# Patient Record
Sex: Male | Born: 1985 | Race: White | Hispanic: No | Marital: Married | Smoking: Never smoker
Health system: Southern US, Community
[De-identification: ages and names within clinical notes are randomized; demographics above are authoritative.]

## PROBLEM LIST (undated history)

## (undated) DIAGNOSIS — J302 Other seasonal allergic rhinitis: Secondary | ICD-10-CM

## (undated) HISTORY — DX: Other seasonal allergic rhinitis: J30.2

---

## 2017-06-17 ENCOUNTER — Encounter: Payer: Self-pay | Admitting: Family Medicine

## 2017-06-17 ENCOUNTER — Ambulatory Visit (INDEPENDENT_AMBULATORY_CARE_PROVIDER_SITE_OTHER): Payer: Managed Care, Other (non HMO) | Admitting: Family Medicine

## 2017-06-17 VITALS — BP 110/72 | HR 87 | Temp 98.3°F | Ht 70.5 in | Wt 272.4 lb

## 2017-06-17 DIAGNOSIS — J301 Allergic rhinitis due to pollen: Secondary | ICD-10-CM | POA: Diagnosis not present

## 2017-06-17 DIAGNOSIS — E669 Obesity, unspecified: Secondary | ICD-10-CM

## 2017-06-17 DIAGNOSIS — E559 Vitamin D deficiency, unspecified: Secondary | ICD-10-CM

## 2017-06-17 DIAGNOSIS — K580 Irritable bowel syndrome with diarrhea: Secondary | ICD-10-CM

## 2017-06-17 DIAGNOSIS — R6882 Decreased libido: Secondary | ICD-10-CM

## 2017-06-17 DIAGNOSIS — Z13220 Encounter for screening for lipoid disorders: Secondary | ICD-10-CM

## 2017-06-17 DIAGNOSIS — B379 Candidiasis, unspecified: Secondary | ICD-10-CM | POA: Diagnosis not present

## 2017-06-17 LAB — COMPREHENSIVE METABOLIC PANEL
ALT: 22 U/L (ref 0–53)
AST: 16 U/L (ref 0–37)
Albumin: 4.5 g/dL (ref 3.5–5.2)
Alkaline Phosphatase: 98 U/L (ref 39–117)
BUN: 15 mg/dL (ref 6–23)
CO2: 29 mEq/L (ref 19–32)
Calcium: 9.5 mg/dL (ref 8.4–10.5)
Chloride: 104 mEq/L (ref 96–112)
Creatinine, Ser: 0.89 mg/dL (ref 0.40–1.50)
GFR: 106.79 mL/min (ref >60.00)
Glucose, Bld: 94 mg/dL (ref 70–99)
Potassium: 4.6 mEq/L (ref 3.5–5.1)
Sodium: 141 mEq/L (ref 135–145)
Total Bilirubin: 0.5 mg/dL (ref 0.2–1.2)
Total Protein: 7 g/dL (ref 6.0–8.3)

## 2017-06-17 LAB — CBC
HCT: 44.3 % (ref 39.0–52.0)
Hemoglobin: 15 g/dL (ref 13.0–17.0)
MCHC: 34 g/dL (ref 30.0–36.0)
MCV: 91.6 fl (ref 78.0–100.0)
Platelets: 227 10*3/uL (ref 150.0–400.0)
RBC: 4.84 Mil/uL (ref 4.22–5.81)
RDW: 13.7 % (ref 11.5–15.5)
WBC: 6.2 10*3/uL (ref 4.0–10.5)

## 2017-06-17 LAB — LIPID PANEL
Cholesterol: 165 mg/dL (ref 0–200)
HDL: 39.8 mg/dL (ref >39.00)
LDL Cholesterol: 117 mg/dL — ABNORMAL HIGH (ref 0–99)
NonHDL: 125.19
Total CHOL/HDL Ratio: 4
Triglycerides: 41 mg/dL (ref 0.0–149.0)
VLDL: 8.2 mg/dL (ref 0.0–40.0)

## 2017-06-17 LAB — SEDIMENTATION RATE: Sed Rate: 2 mm/hr (ref 0–15)

## 2017-06-17 LAB — MAGNESIUM: Magnesium: 1.9 mg/dL (ref 1.5–2.5)

## 2017-06-17 LAB — TESTOSTERONE: Testosterone: 340.75 ng/dL (ref 300.00–890.00)

## 2017-06-17 LAB — VITAMIN B12: Vitamin B-12: 312 pg/mL (ref 211–911)

## 2017-06-17 LAB — TSH: TSH: 1.75 u[IU]/mL (ref 0.35–4.50)

## 2017-06-17 LAB — PSA: PSA: 0.36 ng/mL (ref 0.10–4.00)

## 2017-06-17 LAB — C-REACTIVE PROTEIN: CRP: 0.5 mg/dL (ref 0.5–20.0)

## 2017-06-17 LAB — VITAMIN D 25 HYDROXY (VIT D DEFICIENCY, FRACTURES): VITD: 24.19 ng/mL — ABNORMAL LOW (ref 30.00–100.00)

## 2017-06-17 MED ORDER — FLUTICASONE PROPIONATE 50 MCG/ACT NA SUSP: 2.0000 | Freq: Every day | NASAL | 6 refills | Status: DC

## 2017-06-17 MED ORDER — PSYLLIUM 400 MG PO CAPS: 400.0000 mg | ORAL_CAPSULE | Freq: Every evening | ORAL | 1 refills | Status: DC | PRN

## 2017-06-17 MED ORDER — NYSTATIN-TRIAMCINOLONE 100000-0.1 UNIT/GM-% EX OINT: 1.0000 "application " | TOPICAL_OINTMENT | Freq: Two times a day (BID) | CUTANEOUS | 2 refills | Status: DC

## 2017-06-17 NOTE — Progress Notes (Signed)
Nicholas Decker is a 31 y.o. male is here to [employer].   Patient Care Team: Helane Rima, DO as PCP - General (Family Medicine)   History of Present Illness:   Britt Bottom CMA acting as scribe for Dr. Earlene Plater.  CC: Patient comes in today to establish care. He has several concerns today to discuss. He is would like to discuss IBS. He would also like to do some testing due to having a low sex drive. This has been going on for about a year. He also has decreased hearing in his left ear.   HPI:  1. Irritable bowel syndrome with diarrhea. Symptoms include abdominal cramps, daily diarrhea - up to eight times per day. Stools are semi-formed to liquid. Onset was 3 years ago. Symptoms have been unchanged since. Aggravating factors include stress.  Alleviating factors include none. Associated symptoms include none. The patient denies constipation, dysuria, fever, hematochezia, hematuria, melena, myalgias, nausea, sweats and vomiting. Psychosocial factors: depression: moderate. Work up so far: none.   2. Low libido. Onset of dysfunction was several months ago and was gradual in onset.  Patient states the nature of difficulty is decreased desire. Patient denies history of cardiovascular disease, diabetes mellitus and cranial, spinal, or pelvic trauma. Patient's expectations as to sexual function - he and his wife are trying to become pregnant.  Patient's description of relationship with partneris healthy.    3. Candidiasis Intermittent. Right groin. Has been using wife's Ketoconazole prn with relief.    4. Seasonal allergic rhinitis due to pollen. Not adequately treated.    PMHx, SurgHx, SocialHx, Medications, and Allergies were reviewed in the Visit Navigator and updated as appropriate.   Past Medical History:  Diagnosis Date  . Seasonal allergies    No past surgical history on file.  Family History  Problem Relation Age of Onset  . Cancer Father   . Heart disease Maternal  Grandmother   . Heart disease Maternal Grandfather   . Cancer Paternal Grandfather    Social History  Substance Use Topics  . Smoking status: Never Smoker  . Smokeless tobacco: Never Used  . Alcohol use Yes     Comment: Social maybe twice a year   Current Medications and Allergies:   .  cetirizine (ZYRTEC) 10 MG tablet, Take 10 mg by mouth daily., Disp: , Rfl:  .  Multiple Vitamins-Minerals (EQ MULTIVITAMINS ADULT GUMMY PO), Take by mouth., Disp: , Rfl:   No Known Allergies   Review of Systems:   Review of Systems  Constitutional: Negative for chills, fever and malaise/fatigue.  HENT: Negative for ear pain, sinus pain and sore throat.   Eyes: Negative for blurred vision and double vision.  Respiratory: Negative for cough, shortness of breath and wheezing.   Cardiovascular: Negative for chest pain, palpitations and leg swelling.  Gastrointestinal: Negative for abdominal pain, nausea and vomiting.  Musculoskeletal: Positive for joint pain. Negative for back pain and neck pain.       Both knees chronic.   Neurological: Negative for dizziness and headaches.  Psychiatric/Behavioral: Negative for depression, hallucinations and memory loss.   Vitals:   Vitals:   06/17/17 0924  BP: 110/72  Pulse: 87  Temp: 98.3 F (36.8 C)  TempSrc: Oral  SpO2: 96%  Weight: 272 lb 6.4 oz (123.6 kg)  Height: 5' 10.5" (1.791 m)     Body mass index is 38.53 kg/m.  Physical Exam:   Physical Exam  Constitutional: He is oriented to person, place, and time.  He appears well-developed and well-nourished. No distress.  HENT:  Head: Normocephalic and atraumatic.  Right Ear: External ear normal.  Left Ear: External ear normal.  Nose: Nose normal.  Mouth/Throat: Oropharynx is clear and moist.  Eyes: Conjunctivae and EOM are normal. Pupils are equal, round, and reactive to light.  Neck: Normal range of motion. Neck supple.  Cardiovascular: Normal rate, regular rhythm, normal heart sounds and  intact distal pulses.   Pulmonary/Chest: Effort normal and breath sounds normal.  Abdominal: Soft. Bowel sounds are normal.  Musculoskeletal: Normal range of motion.  Neurological: He is alert and oriented to person, place, and time.  Skin: Skin is warm and dry.  Psychiatric: He has a normal mood and affect. His behavior is normal. Judgment and thought content normal.  Nursing note and vitals reviewed.  Results for orders placed or performed in visit on 06/17/17  CBC  Result Value Ref Range   WBC 6.2 4.0 - 10.5 K/uL   RBC 4.84 4.22 - 5.81 Mil/uL   Platelets 227.0 150.0 - 400.0 K/uL   Hemoglobin 15.0 13.0 - 17.0 g/dL   HCT 45.444.3 09.839.0 - 11.952.0 %   MCV 91.6 78.0 - 100.0 fl   MCHC 34.0 30.0 - 36.0 g/dL   RDW 14.713.7 82.911.5 - 56.215.5 %  Comprehensive metabolic panel  Result Value Ref Range   Sodium 141 135 - 145 mEq/L   Potassium 4.6 3.5 - 5.1 mEq/L   Chloride 104 96 - 112 mEq/L   CO2 29 19 - 32 mEq/L   Glucose, Bld 94 70 - 99 mg/dL   BUN 15 6 - 23 mg/dL   Creatinine, Ser 1.300.89 0.40 - 1.50 mg/dL   Total Bilirubin 0.5 0.2 - 1.2 mg/dL   Alkaline Phosphatase 98 39 - 117 U/L   AST 16 0 - 37 U/L   ALT 22 0 - 53 U/L   Total Protein 7.0 6.0 - 8.3 g/dL   Albumin 4.5 3.5 - 5.2 g/dL   Calcium 9.5 8.4 - 86.510.5 mg/dL   GFR 784.69106.79 >62.95>60.00 mL/min  TSH  Result Value Ref Range   TSH 1.75 0.35 - 4.50 uIU/mL  Vitamin B12  Result Value Ref Range   Vitamin B-12 312 211 - 911 pg/mL  VITAMIN D 25 Hydroxy (Vit-D Deficiency, Fractures)  Result Value Ref Range   VITD 24.19 (L) 30.00 - 100.00 ng/mL  Magnesium  Result Value Ref Range   Magnesium 1.9 1.5 - 2.5 mg/dL  PSA  Result Value Ref Range   PSA 0.36 0.10 - 4.00 ng/mL  Testosterone  Result Value Ref Range   Testosterone 340.75 300.00 - 890.00 ng/dL  Sedimentation rate  Result Value Ref Range   Sed Rate 2 0 - 15 mm/hr  C-reactive protein  Result Value Ref Range   CRP 0.5 0.5 - 20.0 mg/dL  Lipid panel  Result Value Ref Range   Cholesterol 165 0 -  200 mg/dL   Triglycerides 28.441.0 0.0 - 149.0 mg/dL   HDL 13.2439.80 >40.10>39.00 mg/dL   VLDL 8.2 0.0 - 27.240.0 mg/dL   LDL Cholesterol 536117 (H) 0 - 99 mg/dL   Total CHOL/HDL Ratio 4    NonHDL 125.19     Assessment and Plan:   Caryn BeeKevin was seen today for establish care.  Diagnoses and all orders for this visit:  Irritable bowel syndrome with diarrhea Comments: Diarrhea daily. Labs reassuring. Will start with low FODMAP diet and psyllium. Will send to GI for further evaluation if no improvement.  Orders: -  Psyllium 400 MG CAPS; Take 400 mg by mouth at bedtime as needed. -     CBC -     Comprehensive metabolic panel -     Vitamin B12 -     Magnesium -     Sedimentation rate -     C-reactive protein  Low libido Comments: Labs reassuring. Testosterone mildly low. Recommend exercise and restorative sleep. If no improvement, consider sleep study. Orders: -     TSH -     PSA -     Testosterone  Candidiasis -     nystatin-triamcinolone ointment (MYCOLOG); Apply 1 application topically 2 (two) times daily.  Lipid screening -     Lipid panel  Obesity (BMI 30-39.9) Comments: The patient is asked to make an attempt to improve diet and exercise patterns to aid in medical management of this problem.  Orders: -     VITAMIN D 25 Hydroxy (Vit-D Deficiency, Fractures)  Seasonal allergic rhinitis due to pollen -     fluticasone (FLONASE) 50 MCG/ACT nasal spray; Place 2 sprays into both nostrils daily.  Vitamin D deficiency -     VITAMIN D 25 Hydroxy (Vit-D Deficiency, Fractures) -     Cholecalciferol 50000 units TABS; 50,000 units PO qwk for 8 weeks.    . Reviewed expectations re: course of current medical issues. . Discussed self-management of symptoms. . Outlined signs and symptoms indicating need for more acute intervention. . Patient verbalized understanding and all questions were answered. Marland Kitchen Health Maintenance issues including appropriate healthy diet, exercise, and smoking avoidance  were discussed with patient. . See orders for this visit as documented in the electronic medical record. . Patient received an After Visit Summary.  CMA served as Neurosurgeon during this visit. History, Physical, and Plan performed by medical provider. The above documentation has been reviewed and is accurate and complete. Helane Rima, D.O.  Helane Rima, DO [place], [place] 06/22/2017  Future Appointments Date Time Provider Department Center  09/17/2017 7:30 AM Helane Rima, DO [facility] None

## 2017-06-22 ENCOUNTER — Encounter: Payer: Self-pay | Admitting: Family Medicine

## 2017-06-22 DIAGNOSIS — E669 Obesity, unspecified: Secondary | ICD-10-CM | POA: Insufficient documentation

## 2017-06-22 MED ORDER — CHOLECALCIFEROL 1.25 MG (50000 UT) PO TABS: ORAL_TABLET | ORAL | 0 refills | Status: DC

## 2017-09-17 ENCOUNTER — Encounter: Payer: Self-pay | Admitting: Family Medicine

## 2017-09-17 ENCOUNTER — Ambulatory Visit (INDEPENDENT_AMBULATORY_CARE_PROVIDER_SITE_OTHER): Payer: Managed Care, Other (non HMO) | Admitting: Family Medicine

## 2017-09-17 VITALS — BP 116/68 | HR 97 | Temp 98.2°F | Ht 70.5 in | Wt 284.0 lb

## 2017-09-17 DIAGNOSIS — G4730 Sleep apnea, unspecified: Secondary | ICD-10-CM | POA: Diagnosis not present

## 2017-09-17 DIAGNOSIS — F341 Dysthymic disorder: Secondary | ICD-10-CM

## 2017-09-17 MED ORDER — BUPROPION HCL ER (XL) 150 MG PO TB24: 150.0000 mg | ORAL_TABLET | Freq: Every day | ORAL | 3 refills | Status: DC

## 2017-09-17 NOTE — Patient Instructions (Signed)
Your vitamin D level is low. This is not dangerous but can affect both your mental and physical health. There is a lot of debate at present about the significance of this finding but we recommend that you take extra vitamin D to get you back into the normal range. I would suggest you pick up an over the counter supplement. Usually 2,000 units daily is a nice maintenance dose.

## 2017-09-17 NOTE — Progress Notes (Signed)
Nicholas Decker is a 31 y.o. male is here for follow up.  History of Present Illness:   Britt Bottom CMA acting as scribe for Dr. Earlene Plater.  HPI:   1. Morbid obesity.   Wt Readings from Last 3 Encounters:  09/17/17 284 lb (128.8 kg)  06/17/17 272 lb 6.4 oz (123.6 kg)    2. Dysthymia.   Depression symptoms: anhedonia, fatigue. Current psychosocial stressors include: home, work.   Treatment to date has included Medication.  Symptoms have been gradually worsening since onset of treatment.   Alcohol use: no.  Drug use: no.  Exercise: minimal.   Patient denies current suicidal and homicidal ideation.    3. Fatigue.   Lab Results  Component Value Date   TESTOSTERONE 340.75 06/17/2017     Health Maintenance Due  Topic Date Due  . INFLUENZA VACCINE  07/30/2017   Depression screen [facility] 2/9 09/17/2017 09/17/2017  Decreased Interest 2 0  Down, Depressed, Hopeless 1 0  PHQ - 2 Score 3 0  Altered sleeping 3 -  Tired, decreased energy 3 -  Change in appetite 1 -  Feeling bad or failure about yourself  2 -  Trouble concentrating 3 -  Moving slowly or fidgety/restless 1 -  Suicidal thoughts 0 -  PHQ-9 Score 16 -  Difficult doing work/chores Not difficult at all -   PMHx, SurgHx, SocialHx, FamHx, Medications, and Allergies were reviewed in the Visit Navigator and updated as appropriate.   Patient Active Problem List   Diagnosis Date Noted  . Obesity (BMI 30-39.9) 06/22/2017   Social History  Substance Use Topics  . Smoking status: Never Smoker  . Smokeless tobacco: Never Used  . Alcohol use Yes     Comment: Social maybe twice a year   Current Medications and Allergies:   .  cetirizine (ZYRTEC) 10 MG tablet, Take 10 mg by mouth daily., Disp: , Rfl:  .  Cholecalciferol 50000 units TABS, 50,000 units PO qwk for 8 weeks., Disp: 8 tablet, Rfl: 0 .  fluticasone (FLONASE) 50 MCG/ACT nasal spray, Place 2 sprays into both nostrils daily., Disp: 16 g, Rfl: 6 .  Multiple  Vitamins-Minerals (EQ MULTIVITAMINS ADULT GUMMY PO), Take by mouth., Disp: , Rfl:  .  nystatin-triamcinolone ointment (MYCOLOG), Apply 1 application topically 2 (two) times daily., Disp: 60 g, Rfl: 2  No Known Allergies   Review of Systems   Pertinent items are noted in the HPI. Otherwise, ROS is negative.  Vitals:   Vitals:   09/17/17 0740  BP: 116/68  Pulse: 97  Temp: 98.2 F (36.8 C)  TempSrc: Oral  SpO2: 100%  Weight: 284 lb (128.8 kg)  Height: 5' 10.5" (1.791 m)     Body mass index is 40.17 kg/m.   Physical Exam:   Physical Exam  Constitutional: He is oriented to person, place, and time. He appears well-developed and well-nourished. No distress.  HENT:  Head: Normocephalic and atraumatic.  Right Ear: External ear normal.  Left Ear: External ear normal.  Nose: Nose normal.  Mouth/Throat: Oropharynx is clear and moist.  Eyes: Pupils are equal, round, and reactive to light. Conjunctivae and EOM are normal.  Neck: Normal range of motion. Neck supple.  Cardiovascular: Normal rate, regular rhythm, normal heart sounds and intact distal pulses.   Pulmonary/Chest: Effort normal and breath sounds normal.  Abdominal: Soft. Bowel sounds are normal.  Musculoskeletal: Normal range of motion.  Neurological: He is alert and oriented to person, place, and time.  Skin:  Skin is warm and dry.  Psychiatric: He has a normal mood and affect. His behavior is normal. Judgment and thought content normal.  Nursing note and vitals reviewed.   Results for orders placed or performed in visit on 06/17/17  CBC  Result Value Ref Range   WBC 6.2 4.0 - 10.5 K/uL   RBC 4.84 4.22 - 5.81 Mil/uL   Platelets 227.0 150.0 - 400.0 K/uL   Hemoglobin 15.0 13.0 - 17.0 g/dL   HCT 16.1 09.6 - 04.5 %   MCV 91.6 78.0 - 100.0 fl   MCHC 34.0 30.0 - 36.0 g/dL   RDW 40.9 81.1 - 91.4 %  Comprehensive metabolic panel  Result Value Ref Range   Sodium 141 135 - 145 mEq/L   Potassium 4.6 3.5 - 5.1 mEq/L    Chloride 104 96 - 112 mEq/L   CO2 29 19 - 32 mEq/L   Glucose, Bld 94 70 - 99 mg/dL   BUN 15 6 - 23 mg/dL   Creatinine, Ser 7.82 0.40 - 1.50 mg/dL   Total Bilirubin 0.5 0.2 - 1.2 mg/dL   Alkaline Phosphatase 98 39 - 117 U/L   AST 16 0 - 37 U/L   ALT 22 0 - 53 U/L   Total Protein 7.0 6.0 - 8.3 g/dL   Albumin 4.5 3.5 - 5.2 g/dL   Calcium 9.5 8.4 - 95.6 mg/dL   GFR 213.08 >65.78 mL/min  TSH  Result Value Ref Range   TSH 1.75 0.35 - 4.50 uIU/mL  Vitamin B12  Result Value Ref Range   Vitamin B-12 312 211 - 911 pg/mL  VITAMIN D 25 Hydroxy (Vit-D Deficiency, Fractures)  Result Value Ref Range   VITD 24.19 (L) 30.00 - 100.00 ng/mL  Magnesium  Result Value Ref Range   Magnesium 1.9 1.5 - 2.5 mg/dL  PSA  Result Value Ref Range   PSA 0.36 0.10 - 4.00 ng/mL  Testosterone  Result Value Ref Range   Testosterone 340.75 300.00 - 890.00 ng/dL  Sedimentation rate  Result Value Ref Range   Sed Rate 2 0 - 15 mm/hr  C-reactive protein  Result Value Ref Range   CRP 0.5 0.5 - 20.0 mg/dL  Lipid panel  Result Value Ref Range   Cholesterol 165 0 - 200 mg/dL   Triglycerides 46.9 0.0 - 149.0 mg/dL   HDL 62.95 >28.41 mg/dL   VLDL 8.2 0.0 - 32.4 mg/dL   LDL Cholesterol 401 (H) 0 - 99 mg/dL   Total CHOL/HDL Ratio 4    NonHDL 125.19    Assessment and Plan:   Diagnoses and all orders for this visit:  Morbid obesity (HCC) Comments: The patient is asked to make an attempt to improve diet and exercise patterns to aid in medical management of this problem.  Orders: -     Home sleep test; Future  Dysthymia Comments: Patient was given information on SNRIs and possible side effects were reviewed. He was asked to contact us with any worsening in symptoms or suicidal thoughts and we discussed that it would take 2-4 weeks to begin to see improvement in his symptoms.  Orders:  -     buPROPion (WELLBUTRIN XL) 150 MG 24 hr tablet; Take 1 tablet (150 mg total) by mouth daily.  Sleep-disordered  breathing -     Home sleep test; Future   . Reviewed expectations re: course of current medical issues. . Discussed self-management of symptoms. . Outlined signs and symptoms indicating need for more acute intervention. Marland Kitchen  Patient verbalized understanding and all questions were answered. Marland Kitchen Health Maintenance issues including appropriate healthy diet, exercise, and smoking avoidance were discussed with patient. . See orders for this visit as documented in the electronic medical record. . Patient received an After Visit Summary.  CMA served as Neurosurgeon during this visit. History, Physical, and Plan performed by medical provider. The above documentation has been reviewed and is accurate and complete. Helane Rima, D.O.  Helane Rima, DO [place], [place] 09/20/2017  No future appointments.

## 2017-10-13 ENCOUNTER — Telehealth: Payer: Self-pay | Admitting: *Deleted

## 2017-10-13 NOTE — Telephone Encounter (Signed)
Will speak with Mellody Dance as this likely has to do with the referral that was placed for sleep study.

## 2017-10-13 NOTE — Telephone Encounter (Signed)
Chaz from [facility] is calling pertiaing to the patient sleepy study and the forms that was filled out . He is requesting a call back . His call back number is [phone]. Please advise. Thank you

## 2017-10-14 NOTE — Telephone Encounter (Signed)
Nicholas Decker - can you provide feedback ?

## 2017-10-14 NOTE — Telephone Encounter (Signed)
Spoke with Nicholas Decker- following up to explain their new procedures for HST, and how it affects our clinic and current patient. Nothing changing, will follow up as needed. Will give information to patients PCP.

## 2017-11-30 ENCOUNTER — Other Ambulatory Visit: Payer: Self-pay

## 2017-11-30 ENCOUNTER — Emergency Department (HOSPITAL_COMMUNITY)
Admission: EM | Admit: 2017-11-30 | Discharge: 2017-11-30 | Disposition: A | Discharge status: Home / Self Care | Payer: Managed Care, Other (non HMO) | Attending: Emergency Medicine | Admitting: Emergency Medicine

## 2017-11-30 ENCOUNTER — Encounter (HOSPITAL_COMMUNITY): Payer: Self-pay | Admitting: Emergency Medicine

## 2017-11-30 ENCOUNTER — Emergency Department (HOSPITAL_COMMUNITY): Payer: Managed Care, Other (non HMO)

## 2017-11-30 DIAGNOSIS — Z79899 Other long term (current) drug therapy: Secondary | ICD-10-CM | POA: Insufficient documentation

## 2017-11-30 DIAGNOSIS — N200 Calculus of kidney: Secondary | ICD-10-CM | POA: Diagnosis not present

## 2017-11-30 DIAGNOSIS — J302 Other seasonal allergic rhinitis: Secondary | ICD-10-CM | POA: Diagnosis not present

## 2017-11-30 DIAGNOSIS — R1032 Left lower quadrant pain: Secondary | ICD-10-CM | POA: Diagnosis present

## 2017-11-30 LAB — URINALYSIS, ROUTINE W REFLEX MICROSCOPIC
Bilirubin Urine: NEGATIVE
GLUCOSE, UA: NEGATIVE mg/dL
KETONES UR: NEGATIVE mg/dL
Leukocytes, UA: NEGATIVE
NITRITE: NEGATIVE
PH: 5 (ref 5.0–8.0)
Protein, ur: NEGATIVE mg/dL
SPECIFIC GRAVITY, URINE: 1.034 — AB (ref 1.005–1.030)

## 2017-11-30 LAB — COMPREHENSIVE METABOLIC PANEL
ALBUMIN: 4 g/dL (ref 3.5–5.0)
ALK PHOS: 100 U/L (ref 38–126)
ALT: 23 U/L (ref 17–63)
AST: 21 U/L (ref 15–41)
Anion gap: 10 (ref 5–15)
BILIRUBIN TOTAL: 0.9 mg/dL (ref 0.3–1.2)
BUN: 18 mg/dL (ref 6–20)
CALCIUM: 9 mg/dL (ref 8.9–10.3)
CO2: 24 mmol/L (ref 22–32)
CREATININE: 1.14 mg/dL (ref 0.61–1.24)
Chloride: 104 mmol/L (ref 101–111)
GFR calc Af Amer: >60 mL/min (ref >60)
GFR calc non Af Amer: >60 mL/min (ref >60)
GLUCOSE: 121 mg/dL — AB (ref 65–99)
Potassium: 3.7 mmol/L (ref 3.5–5.1)
SODIUM: 138 mmol/L (ref 135–145)
TOTAL PROTEIN: 7.5 g/dL (ref 6.5–8.1)

## 2017-11-30 LAB — CBC
HCT: 42.3 % (ref 39.0–52.0)
Hemoglobin: 14.7 g/dL (ref 13.0–17.0)
MCH: 31.3 pg (ref 26.0–34.0)
MCHC: 34.8 g/dL (ref 30.0–36.0)
MCV: 90 fL (ref 78.0–100.0)
PLATELETS: 205 10*3/uL (ref 150–400)
RBC: 4.7 MIL/uL (ref 4.22–5.81)
RDW: 13.1 % (ref 11.5–15.5)
WBC: 11 10*3/uL — ABNORMAL HIGH (ref 4.0–10.5)

## 2017-11-30 LAB — LIPASE, BLOOD: Lipase: 81 U/L — ABNORMAL HIGH (ref 11–51)

## 2017-11-30 MED ORDER — IBUPROFEN 600 MG PO TABS: 600.0000 mg | ORAL_TABLET | Freq: Four times a day (QID) | ORAL | 0 refills | Status: DC | PRN

## 2017-11-30 MED ORDER — MORPHINE SULFATE (PF) 4 MG/ML IV SOLN
4.0000 mg | Freq: Once | INTRAVENOUS | Status: AC
  Administered 2017-11-30: 4 mg via INTRAVENOUS
  Filled 2017-11-30: qty 1

## 2017-11-30 MED ORDER — KETOROLAC TROMETHAMINE 30 MG/ML IJ SOLN
30.0000 mg | Freq: Once | INTRAMUSCULAR | Status: AC
  Administered 2017-11-30: 30 mg via INTRAVENOUS
  Filled 2017-11-30: qty 1

## 2017-11-30 MED ORDER — TAMSULOSIN HCL 0.4 MG PO CAPS: 0.4000 mg | ORAL_CAPSULE | Freq: Every day | ORAL | 0 refills | Status: DC

## 2017-11-30 MED ORDER — ONDANSETRON HCL 4 MG/2ML IJ SOLN
4.0000 mg | Freq: Once | INTRAMUSCULAR | Status: AC
  Administered 2017-11-30: 4 mg via INTRAVENOUS
  Filled 2017-11-30: qty 2

## 2017-11-30 NOTE — ED Provider Notes (Signed)
[facility]-EMERGENCY DEPT Provider Note   CSN: [id number] Arrival date & time: 11/30/17  4098     History   Chief Complaint Chief Complaint  Patient presents with  . Abdominal Pain    HPI Nicholas Decker is a 31 y.o. male.  HPI   31 year old male presenting for evaluation of abdominal pain.  Patient developed acute onset of left lower quadrant abdominal pain that started approximately 3 hours ago while waking him up from sleep.  Pain is intense, nonradiating, nothing seems to make it better or worse.  He felt nauseous and vomited but it provided no relief.  He tries taking some cold and flu medication to help him back to sleep.  He states that no position is comfortable for him.  He has never had this kind of pain before.  No associated fever, chills, lightheadedness, dizziness, chest pain, shortness of breath, dysuria, hematuria, bowel or bladder changes, recent injury, or rash.  No history of kidney stone.  No prior history of abdominal surgery.  States pain is moderate to severe currently.  Past Medical History:  Diagnosis Date  . Seasonal allergies     Patient Active Problem List   Diagnosis Date Noted  . Obesity (BMI 30-39.9) 06/22/2017         Home Medications    Prior to Admission medications   Medication Sig Start Date End Date Taking? Authorizing Provider  buPROPion (WELLBUTRIN XL) 150 MG 24 hr tablet Take 1 tablet (150 mg total) by mouth daily. 09/17/17   Helane Rima, DO  cetirizine (ZYRTEC) 10 MG tablet Take 10 mg by mouth daily.    [provider]  Cholecalciferol 50000 units TABS 50,000 units PO qwk for 8 weeks. 06/22/17   Helane Rima, DO  fluticasone (FLONASE) 50 MCG/ACT nasal spray Place 2 sprays into both nostrils daily. 06/17/17   Helane Rima, DO  Multiple Vitamins-Minerals (EQ MULTIVITAMINS ADULT GUMMY PO) Take by mouth.    [provider]  nystatin-triamcinolone ointment (MYCOLOG) Apply 1 application  topically 2 (two) times daily. 06/17/17   Helane Rima, DO  Psyllium 400 MG CAPS Take 400 mg by mouth at bedtime as needed. 06/17/17   Helane Rima, DO    Family History Family History  Problem Relation Age of Onset  . Cancer Father   . Heart disease Maternal Grandmother   . Heart disease Maternal Grandfather   . Cancer Paternal Grandfather     Social History Social History   Tobacco Use  . Smoking status: Never Smoker  . Smokeless tobacco: Never Used  Substance Use Topics  . Alcohol use: Yes    Comment: Social maybe twice a year  . Drug use: No     Allergies   Patient has no known allergies.   Review of Systems Review of Systems  All other systems reviewed and are negative.    Physical Exam Updated Vital Signs BP 117/71 (BP Location: Left Arm)   Pulse 66   Temp 97.7 F (36.5 C) (Oral)   Resp 20   Ht 5\' 11"  (1.803 m)   Wt 122.5 kg (270 lb)   SpO2 100%   BMI 37.66 kg/m   Physical Exam  Constitutional: He appears well-developed and well-nourished. He appears distressed (Patient appears uncomfortable, writhing around).  HENT:  Head: Atraumatic.  Eyes: Conjunctivae are normal.  Neck: Neck supple.  Cardiovascular: Normal rate and regular rhythm.  Pulmonary/Chest: Effort normal and breath sounds normal.  Abdominal: Soft. Normal appearance. There is tenderness (  Mild tenderness left lower quadrant on palpation without guarding or rebound tenderness). There is no CVA tenderness, no tenderness at McBurney's point and negative Murphy's sign. No hernia. Hernia confirmed negative in the right inguinal area and confirmed negative in the left inguinal area.  Genitourinary: Testes normal and penis normal. Right testis shows no tenderness. Left testis shows no tenderness.  Neurological: He is alert.  Skin: No rash noted.  Psychiatric: He has a normal mood and affect.  Nursing note and vitals reviewed.    ED Treatments / Results  Labs (all labs ordered are listed,  but only abnormal results are displayed) Labs Reviewed  LIPASE, BLOOD - Abnormal; Notable for the following components:      Result Value   Lipase 81 (*)    All other components within normal limits  COMPREHENSIVE METABOLIC PANEL - Abnormal; Notable for the following components:   Glucose, Bld 121 (*)    All other components within normal limits  CBC - Abnormal; Notable for the following components:   WBC 11.0 (*)    All other components within normal limits  URINALYSIS, ROUTINE W REFLEX MICROSCOPIC - Abnormal; Notable for the following components:   Specific Gravity, Urine 1.034 (*)    Hgb urine dipstick SMALL (*)    Bacteria, UA FEW (*)    Squamous Epithelial / LPF 0-5 (*)    All other components within normal limits    EKG  EKG Interpretation None       Radiology Ct Renal Stone Study  Result Date: 11/30/2017 CLINICAL DATA:  Left abdominal pain EXAM: CT ABDOMEN AND PELVIS WITHOUT CONTRAST TECHNIQUE: Multidetector CT imaging of the abdomen and pelvis was performed following the standard protocol without IV contrast. COMPARISON:  None. FINDINGS: Lower chest: Lung bases are clear. No effusions. Heart is normal size. Hepatobiliary: No focal hepatic abnormality. Gallbladder unremarkable. Pancreas: No focal abnormality or ductal dilatation. Spleen: No focal abnormality.  Normal size. Adrenals/Urinary Tract: Adrenal glands unremarkable. Punctate nonobstructing bilateral renal stones. Mild left hydronephrosis due to a punctate 2 mm distal left ureteral stone. Urinary bladder decompressed. Stomach/Bowel: Stomach, large and small bowel grossly unremarkable. Vascular/Lymphatic: No evidence of aneurysm or adenopathy. Reproductive: No visible focal abnormality. Other: No free fluid or free air. Musculoskeletal: No acute bony abnormality. IMPRESSION: 2 mm distal left ureteral stone with mild left hydronephrosis. Bilateral punctate nephrolithiasis. Electronically Signed   By: Charlett NoseKevin  Dover M.D.   On:  11/30/2017 07:13    Procedures Procedures (including critical care time)  Medications Ordered in ED Medications  morphine 4 MG/ML injection 4 mg (4 mg Intravenous Given 11/30/17 0647)  ondansetron (ZOFRAN) injection 4 mg (4 mg Intravenous Given 11/30/17 0647)  ketorolac (TORADOL) 30 MG/ML injection 30 mg (30 mg Intravenous Given 11/30/17 0747)     Initial Impression / Assessment and Plan / ED Course  I have reviewed the triage vital signs and the nursing notes.  Pertinent labs & imaging results that were available during my care of the patient were reviewed by me and considered in my medical decision making (see chart for details).     BP (!) 111/57 (BP Location: Left Arm)   Pulse 92   Temp 97.7 F (36.5 C) (Oral)   Resp 18   Ht 5\' 11"  (1.803 m)   Wt 122.5 kg (270 lb)   SpO2 97%   BMI 37.66 kg/m    Final Clinical Impressions(s) / ED Diagnoses   Final diagnoses:  Kidney stone on left side  ED Discharge Orders        Ordered    ibuprofen (ADVIL,MOTRIN) 600 MG tablet  Every 6 hours PRN     11/30/17 0955    tamsulosin (FLOMAX) 0.4 MG CAPS capsule  Daily     11/30/17 0955     6:41 AM Patient here with acute onset of left lower abdomen pain.  No CVA tenderness on exam however his symptoms suggestive of a potential small kidney stone.  Workup initiated, will obtain abdominal pelvic CT scan for further evaluation.  Pain medication and antinausea medication given.  9:59 AM CT scan demonstrate a 2 mm kidney stone at the left UVJ with mild hydronephrosis.  Urine shows no signs of urinary tract infection.  Labs are reassuring.  Patient symptoms improved with treatment.  He felt much better.  He is stable for discharge.  Recommend follow-up with urology as needed.  Return precautions discussed.  Work note provided as requested.   Fayrene Helperran, Gaurav Baldree, PA-C 11/30/17 1000    Molpus, Jonny RuizJohn, MD 11/30/17 2234

## 2017-11-30 NOTE — Discharge Instructions (Signed)
You have a 2mm kidney stone on the left side.  You should be able to pass this stone in the next few days.  Take ibuprofen as needed for pain.  Take Flomax to help pass the stone.  Follow up with urology for further care.  Return if you have any concerns.

## 2017-11-30 NOTE — ED Triage Notes (Signed)
Patient is complaining of upper left abdominal pain. Patient states he went to the bathroom at 03:45 am because his abdomen was in excruciating pain. Patient stated the pain did not go away.

## 2017-12-01 ENCOUNTER — Telehealth: Payer: Self-pay

## 2017-12-01 NOTE — Telephone Encounter (Signed)
Called patient states no change in problems or symptoms was just asked by ED to call our office today so that we would be informed.

## 2017-12-01 NOTE — Telephone Encounter (Signed)
Noted. Come in for a visit if it doesn't pass or worsening symptoms.

## 2017-12-01 NOTE — Telephone Encounter (Signed)
Copied from CRM [id number]. Topic: Quick Communication - Other Results >> Dec 01, 2017  9:40 AM Crist InfanteHarrald, Kathy J wrote: Pt states he went to the ED Sunday 12/02 and has a kidney stone.  Pt has not passed and they advised pt to call his dr (Dr Lequita AsalWallace)and let her know.

## 2017-12-04 ENCOUNTER — Telehealth: Payer: Self-pay

## 2017-12-04 NOTE — Telephone Encounter (Signed)
Nicholas Decker patient was seen on 11/30/2017 in the ED for kidney stone.  ED prescribed him 5 capsules of Flomax.  Received fax from pharmacy requesting refill.  Patient is still having pain and has not yet passed the stone.   Flomax 0.4 mg once daily.  Will you authorize this refill in Dr. Philis PiqueWallace's absence?

## 2017-12-04 NOTE — Telephone Encounter (Signed)
Yes thanks- you can send this in #14- does he have follow up planned with Dr. Earlene PlaterWallace or urology

## 2017-12-05 ENCOUNTER — Other Ambulatory Visit: Payer: Self-pay

## 2017-12-05 MED ORDER — TAMSULOSIN HCL 0.4 MG PO CAPS: 0.4000 mg | ORAL_CAPSULE | Freq: Every day | ORAL | 0 refills | Status: DC

## 2017-12-05 NOTE — Telephone Encounter (Signed)
Thank you.  He does not have follow up scheduled with either, but I will speak with him and advise him to do so.

## 2017-12-15 ENCOUNTER — Other Ambulatory Visit: Payer: Self-pay | Admitting: Family Medicine

## 2018-01-08 ENCOUNTER — Telehealth: Payer: Self-pay | Admitting: Family Medicine

## 2018-01-08 NOTE — Telephone Encounter (Signed)
See note

## 2018-01-08 NOTE — Telephone Encounter (Signed)
Please advise 

## 2018-01-08 NOTE — Telephone Encounter (Signed)
Please call patient to confirm with him. Okay to increase to 300 mg daily. Will need to call in.

## 2018-01-08 NOTE — Telephone Encounter (Signed)
Copied from CRM [id number]. Topic: Quick Communication - See Telephone Encounter >> Jan 08, 2018  2:08 PM Cipriano BunkerLambe, Annette S wrote: CRM for notification. See Telephone encounter for:   Pt. Started therapist with Areta HaberBonnie Kennedy and she said that Caryn BeeKevin would benefit from a higher dosage of Wellbutrin 150 possible or take what taking now maybe 2 times a day.   [facility] [address] - [place], [place] - [address]  [place] [address] Phone: [phone] Fax: [phone]   01/08/18.

## 2018-01-09 MED ORDER — BUPROPION HCL ER (XL) 300 MG PO TB24: 300.0000 mg | ORAL_TABLET | Freq: Every day | ORAL | 0 refills | Status: DC

## 2018-01-09 NOTE — Telephone Encounter (Signed)
Spoke to pt, told him Dr. Earlene PlaterWallace said it was okay to increase to Wellbutrin 300 mg daily and I will send new Rx to the pharmacy. Pt verbalized understanding. Pt also asked if sleep study results were back it has been awhile. Told pt I do not see results but will look into it. Pt said that is fine no rush. Rx sent to pharmacy.

## 2018-01-11 ENCOUNTER — Other Ambulatory Visit: Payer: Self-pay | Admitting: Family Medicine

## 2018-01-11 DIAGNOSIS — F341 Dysthymic disorder: Secondary | ICD-10-CM

## 2018-02-24 ENCOUNTER — Ambulatory Visit: Payer: Managed Care, Other (non HMO) | Admitting: Family Medicine

## 2018-02-24 ENCOUNTER — Encounter: Payer: Self-pay | Admitting: Family Medicine

## 2018-02-24 VITALS — BP 126/82 | HR 103 | Temp 98.3°F | Ht 70.5 in | Wt 277.6 lb

## 2018-02-24 DIAGNOSIS — J0100 Acute maxillary sinusitis, unspecified: Secondary | ICD-10-CM

## 2018-02-24 MED ORDER — AMOXICILLIN-POT CLAVULANATE 875-125 MG PO TABS
1.0000 | ORAL_TABLET | Freq: Two times a day (BID) | ORAL | 0 refills | Status: DC
Start: 2018-02-24 — End: 2018-09-01

## 2018-02-24 MED ORDER — PREDNISONE 5 MG PO TABS: ORAL_TABLET | ORAL | 0 refills | Status: DC

## 2018-02-24 NOTE — Progress Notes (Signed)
Nicholas Decker is a 32 y.o. male here for an acute visit.  History of Present Illness:   Britt Bottom CMA acting as scribe for Dr. Earlene Plater.  HPI: Patient comes in today for for Bilateral ear pressure. He has had head congestion and headache for about one week. He has tried OTC sinus medication with no relief.  PMHx, SurgHx, SocialHx, Medications, and Allergies were reviewed in the Visit Navigator and updated as appropriate.  Current Medications:   Current Outpatient Medications:  .  buPROPion (WELLBUTRIN XL) 300 MG 24 hr tablet, Take 1 tablet (300 mg total) by mouth daily., Disp: 90 tablet, Rfl: 0 .  cetirizine (ZYRTEC) 10 MG tablet, Take 10 mg by mouth daily., Disp: , Rfl:  .  Cholecalciferol 50000 units TABS, 50,000 units PO qwk for 8 weeks., Disp: 8 tablet, Rfl: 0 .  ibuprofen (ADVIL,MOTRIN) 600 MG tablet, Take 1 tablet (600 mg total) by mouth every 6 (six) hours as needed., Disp: 30 tablet, Rfl: 0 .  tamsulosin (FLOMAX) 0.4 MG CAPS capsule, Take 1 capsule (0.4 mg total) by mouth daily., Disp: 14 capsule, Rfl: 0   No Known Allergies   Review of Systems:  Review of Systems  Constitutional: Positive for chills. Negative for fever.  HENT: Positive for congestion, ear pain and sinus pain. Negative for hearing loss.   Eyes: Positive for blurred vision. Negative for double vision.  Respiratory: Negative for cough and shortness of breath.   Cardiovascular: Negative for chest pain and palpitations.  Gastrointestinal: Negative for nausea and vomiting.  Genitourinary: Negative for dysuria.  Musculoskeletal: Negative for back pain and neck pain.  Neurological: Positive for headaches. Negative for dizziness.  Psychiatric/Behavioral: Negative for depression.   Pertinent items are noted in the HPI. Otherwise, ROS is negative.  Vitals:   Vitals:   02/24/18 1500  BP: 126/82  Pulse: (!) 103  Temp: 98.3 F (36.8 C)  TempSrc: Oral  SpO2: 97%  Weight: 277 lb 9.6 oz (125.9 kg)    Height: 5' 10.5" (1.791 m)     Body mass index is 39.27 kg/m.  Physical Exam:   Physical Exam  Constitutional: He is oriented to person, place, and time. He appears well-developed and well-nourished. No distress.  HENT:  Head: Normocephalic and atraumatic.  Right Ear: External ear normal.  Left Ear: External ear normal.  Nose: Right sinus exhibits maxillary sinus tenderness and frontal sinus tenderness. Left sinus exhibits maxillary sinus tenderness and frontal sinus tenderness.  Mouth/Throat: Oropharynx is clear and moist.  Eyes: Conjunctivae and EOM are normal. Pupils are equal, round, and reactive to light.  Neck: Normal range of motion. Neck supple.  Cardiovascular: Normal rate, regular rhythm, normal heart sounds and intact distal pulses.  Pulmonary/Chest: Effort normal and breath sounds normal.  Abdominal: Soft. Bowel sounds are normal.  Musculoskeletal: Normal range of motion.  Neurological: He is alert and oriented to person, place, and time.  Skin: Skin is warm and dry.  Psychiatric: He has a normal mood and affect. His behavior is normal. Judgment and thought content normal.  Nursing note and vitals reviewed.   Assessment and Plan:   1. Subacute maxillary sinusitis - predniSONE (DELTASONE) 5 MG tablet; 6-5-4-3-2-1-off  Dispense: 21 tablet; Refill: 0 - amoxicillin-clavulanate (AUGMENTIN) 875-125 MG tablet; Take 1 tablet by mouth 2 (two) times daily.  Dispense: 20 tablet; Refill: 0  . Reviewed expectations re: course of current medical issues. . Discussed self-management of symptoms. . Outlined signs and symptoms indicating need for more  acute intervention. . Patient verbalized understanding and all questions were answered. Marland Kitchen. Health Maintenance issues including appropriate healthy diet, exercise, and smoking avoidance were discussed with patient. . See orders for this visit as documented in the electronic medical record. . Patient received an After Visit  Summary.  CMA served as Neurosurgeonscribe during this visit. History, Physical, and Plan performed by medical provider. The above documentation has been reviewed and is accurate and complete. Helane RimaErica Haden Cavenaugh, D.O.   Helane RimaErica Shantale Holtmeyer, DO [facility] 02/28/2018

## 2018-02-28 ENCOUNTER — Encounter: Payer: Self-pay | Admitting: Family Medicine

## 2018-04-09 ENCOUNTER — Other Ambulatory Visit: Payer: Self-pay | Admitting: Family Medicine

## 2018-05-05 ENCOUNTER — Other Ambulatory Visit: Payer: Self-pay | Admitting: Family Medicine

## 2018-09-01 ENCOUNTER — Encounter: Payer: Self-pay | Admitting: Physician Assistant

## 2018-09-01 ENCOUNTER — Ambulatory Visit: Payer: Managed Care, Other (non HMO) | Admitting: Physician Assistant

## 2018-09-01 ENCOUNTER — Other Ambulatory Visit: Payer: Self-pay | Admitting: Family Medicine

## 2018-09-01 VITALS — BP 126/86 | HR 83 | Temp 98.2°F | Ht 70.5 in | Wt 276.2 lb

## 2018-09-01 DIAGNOSIS — F341 Dysthymic disorder: Secondary | ICD-10-CM | POA: Diagnosis not present

## 2018-09-01 MED ORDER — BUPROPION HCL ER (XL) 300 MG PO TB24: 300.0000 mg | ORAL_TABLET | Freq: Every day | ORAL | 3 refills | Status: DC

## 2018-09-01 NOTE — Patient Instructions (Signed)
It was great to see you!  Please contact your therapist for an appointment. I also recommend inquiring with them about ADHD evaluations. Please have ADHD evaluation faxed to our office and make an appointment to discuss initiation of medications if appropriate.  If you develop any suicidal or homicidal thoughts, please tell someone and go to the ER.  Let's follow-up with Dr. Earlene Plater in 3 months or sooner if you have concerns.  Take care,  Jarold Motto PA-C

## 2018-09-01 NOTE — Progress Notes (Signed)
Nicholas Decker is a 32 y.o. male here for a follow up of a pre-existing problem.  History of Present Illness:   Chief Complaint  Patient presents with  . Medication Refill    HPI   Depression Currently taking Wellbutrin 300 mg daily. Dealing with a little bit of a lull right now, states that he is expecting some divorce papers soon and thinks that this is contributing to his bad mood. Does have a therapist, hasn't seen her recently. He denies anxiety but does have concerns with decreased attention. Has never been formally diagnosed with ADD or ADHD in the past but has concerns that he may have it. He denies any concerning side effects with his medication. Has had occasional suicidal thoughts but none recently and never a plan. Endorses a good support system.   Wt Readings from Last 5 Encounters:  09/01/18 276 lb 3.2 oz (125.3 kg)  02/24/18 277 lb 9.6 oz (125.9 kg)  11/30/17 270 lb (122.5 kg)  09/17/17 284 lb (128.8 kg)  06/17/17 272 lb 6.4 oz (123.6 kg)   Depression screen [facility] 2/9 09/01/2018 09/17/2017 09/17/2017  Decreased Interest 1 2 0  Down, Depressed, Hopeless 1 1 0  PHQ - 2 Score 2 3 0  Altered sleeping 2 3 -  Tired, decreased energy 2 3 -  Change in appetite 0 1 -  Feeling bad or failure about yourself  1 2 -  Trouble concentrating 3 3 -  Moving slowly or fidgety/restless 0 1 -  Suicidal thoughts 1 0 -  PHQ-9 Score 11 16 -  Difficult doing work/chores Somewhat difficult Not difficult at all -     Past Medical History:  Diagnosis Date  . Seasonal allergies      Social History   Socioeconomic History  . Marital status: Married    Spouse name: Not on file  . Number of children: Not on file  . Years of education: Not on file  . Highest education level: Not on file  Occupational History  . Not on file  Social Needs  . Financial resource strain: Not on file  . Food insecurity:    Worry: Not on file    Inability: Not on file  . Transportation needs:    Medical:  Not on file    Non-medical: Not on file  Tobacco Use  . Smoking status: Never Smoker  . Smokeless tobacco: Never Used  Substance and Sexual Activity  . Alcohol use: Yes    Comment: Social maybe twice a year  . Drug use: No  . Sexual activity: Yes    Partners: Female  Lifestyle  . Physical activity:    Days per week: Not on file    Minutes per session: Not on file  . Stress: Not on file  Relationships  . Social connections:    Talks on phone: Not on file    Gets together: Not on file    Attends religious service: Not on file    Active member of club or organization: Not on file    Attends meetings of clubs or organizations: Not on file    Relationship status: Not on file  . Intimate partner violence:    Fear of current or ex partner: Not on file    Emotionally abused: Not on file    Physically abused: Not on file    Forced sexual activity: Not on file  Other Topics Concern  . Not on file  Social History Narrative  . Not  on file    History reviewed. No pertinent surgical history.  Family History  Problem Relation Age of Onset  . Cancer Father   . Heart disease Maternal Grandmother   . Heart disease Maternal Grandfather   . Cancer Paternal Grandfather     No Known Allergies  Current Medications:   Current Outpatient Medications:  .  buPROPion (WELLBUTRIN XL) 300 MG 24 hr tablet, Take 1 tablet (300 mg total) by mouth daily., Disp: 30 tablet, Rfl: 3 .  cetirizine (ZYRTEC) 10 MG tablet, Take 10 mg by mouth daily., Disp: , Rfl:    Review of Systems:   ROS  Negative unless otherwise specified per HPI.   Vitals:   Vitals:   09/01/18 1359  BP: 126/86  Pulse: 83  Temp: 98.2 F (36.8 C)  TempSrc: Oral  SpO2: 97%  Weight: 276 lb 3.2 oz (125.3 kg)  Height: 5' 10.5" (1.791 m)     Body mass index is 39.07 kg/m.  Physical Exam:   Physical Exam  Constitutional: He appears well-developed. He is cooperative.  Non-toxic appearance. He does not have a sickly  appearance. He does not appear ill. No distress.  Cardiovascular: Normal rate, regular rhythm, S1 normal, S2 normal, normal heart sounds and normal pulses.  No LE edema  Pulmonary/Chest: Effort normal and breath sounds normal.  Neurological: He is alert. GCS eye subscore is 4. GCS verbal subscore is 5. GCS motor subscore is 6.  Skin: Skin is warm, dry and intact.  Psychiatric: He has a normal mood and affect. His speech is normal and behavior is normal.  Nursing note and vitals reviewed.   Assessment and Plan:    Nicholas Decker was seen today for medication refill.  Diagnoses and all orders for this visit:  Dysthymia Denies current suicidal thoughts or plans at this time. I recommended that he reach out to his counselor to resume therapy. Also I recommended that he inquire if anyone in that office does ADHD evaluations, and if not, to contact [facility] for formal evaluation. After shared decision making, we decided to keep patient on Wellbutrin 300 mg XL today. I have refilled this. Patient verbalized understanding and is agreeable to plan. I discussed with patient that if they develop any SI, to tell someone immediately and seek medical attention.    Other orders -     buPROPion (WELLBUTRIN XL) 300 MG 24 hr tablet; Take 1 tablet (300 mg total) by mouth daily.    . Reviewed expectations re: course of current medical issues. . Discussed self-management of symptoms. . Outlined signs and symptoms indicating need for more acute intervention. . Patient verbalized understanding and all questions were answered. . See orders for this visit as documented in the electronic medical record. . Patient received an After-Visit Summary.   Jarold Motto, PA-C

## 2018-09-01 NOTE — Telephone Encounter (Signed)
Patient is being seen today by you.

## 2018-12-07 ENCOUNTER — Other Ambulatory Visit: Payer: Self-pay | Admitting: Physician Assistant

## 2019-01-06 ENCOUNTER — Encounter: Payer: Self-pay | Admitting: Physician Assistant

## 2019-01-06 ENCOUNTER — Ambulatory Visit: Payer: Managed Care, Other (non HMO) | Admitting: Physician Assistant

## 2019-01-06 ENCOUNTER — Other Ambulatory Visit (HOSPITAL_COMMUNITY)
Admission: RE | Admit: 2019-01-06 | Discharge: 2019-01-06 | Disposition: A | Discharge status: Home / Self Care | Payer: Managed Care, Other (non HMO) | Source: Ambulatory Visit | Attending: Physician Assistant | Admitting: Physician Assistant

## 2019-01-06 VITALS — BP 120/72 | HR 78 | Temp 98.7°F | Ht 70.5 in | Wt 279.4 lb

## 2019-01-06 DIAGNOSIS — Z113 Encounter for screening for infections with a predominantly sexual mode of transmission: Secondary | ICD-10-CM

## 2019-01-06 DIAGNOSIS — F341 Dysthymic disorder: Secondary | ICD-10-CM | POA: Diagnosis not present

## 2019-01-06 MED ORDER — BUPROPION HCL ER (XL) 300 MG PO TB24: 300.0000 mg | ORAL_TABLET | Freq: Every day | ORAL | 1 refills | Status: DC

## 2019-01-06 NOTE — Progress Notes (Signed)
Nicholas ChihuahuaKevin Decker is a 33 y.o. male is here to follow up on Depression.  I acted as a Neurosurgeonscribe for Energy East CorporationSamantha Celise Bazar, PA-C Corky Mullonna Orphanos, LPN  History of Present Illness:   Chief Complaint  Patient presents with  . Depression    Depression         This is a chronic problem.  The current episode started more than 1 month ago (Pt here for follow up and states he is feeling better since mid November. ).   The onset quality is undetermined.   The problem occurs every several days.  The most recent episode lasted 2 days.    The problem has been gradually improving since onset.  Associated symptoms include decreased concentration and headaches.  Associated symptoms include no fatigue, no helplessness, no hopelessness, does not have insomnia, not irritable, no restlessness, no decreased interest, no appetite change, no body aches, no myalgias, no indigestion, not sad and no suicidal ideas.     The symptoms are aggravated by family issues, work stress and social issues.  Compliance with treatment is good.  Previous treatment provided moderate relief.  Continues on Wellbutrin 300 mg daily and is tolerating well.    Patient would also like STD testing. No symptoms. Currently not sexually active.     [facility]  PHQ-9 Total Score  10       There are no preventive care reminders to display for this patient.  Past Medical History:  Diagnosis Date  . Seasonal allergies      Social History   Socioeconomic History  . Marital status: Married    Spouse name: Not on file  . Number of children: Not on file  . Years of education: Not on file  . Highest education level: Not on file  Occupational History  . Not on file  Social Needs  . Financial resource strain: Not on file  . Food insecurity:    Worry: Not on file    Inability: Not on file  . Transportation needs:    Medical: Not on file    Non-medical: Not on file  Tobacco Use  .  Smoking status: Never Smoker  . Smokeless tobacco: Never Used  Substance and Sexual Activity  . Alcohol use: Yes    Comment: Social maybe twice a year  . Drug use: No  . Sexual activity: Yes    Partners: Female  Lifestyle  . Physical activity:    Days per week: Not on file    Minutes per session: Not on file  . Stress: Not on file  Relationships  . Social connections:    Talks on phone: Not on file    Gets together: Not on file    Attends religious service: Not on file    Active member of club or organization: Not on file    Attends meetings of clubs or organizations: Not on file    Relationship status: Not on file  . Intimate partner violence:    Fear of current or ex partner: Not on file    Emotionally abused: Not on file    Physically abused: Not on file    Forced sexual activity: Not on file  Other Topics Concern  . Not on file  Social History Narrative  . Not on file    History reviewed. No pertinent surgical history.  Family History  Problem Relation Age of Onset  . Cancer Father   . Heart disease  Maternal Grandmother   . Heart disease Maternal Grandfather   . Cancer Paternal Grandfather     PMHx, SurgHx, SocialHx, FamHx, Medications, and Allergies were reviewed in the Visit Navigator and updated as appropriate.   Patient Active Problem List   Diagnosis Date Noted  . Dysthymia 01/06/2019  . Obesity (BMI 30-39.9) 06/22/2017    Social History   Tobacco Use  . Smoking status: Never Smoker  . Smokeless tobacco: Never Used  Substance Use Topics  . Alcohol use: Yes    Comment: Social maybe twice a year  . Drug use: No    Current Medications and Allergies:    Current Outpatient Medications:  .  buPROPion (WELLBUTRIN XL) 300 MG 24 hr tablet, Take 1 tablet (300 mg total) by mouth daily., Disp: 90 tablet, Rfl: 1 .  cetirizine (ZYRTEC) 10 MG tablet, Take 10 mg by mouth daily., Disp: , Rfl:   No Known Allergies  Review of Systems   Review of Systems    Constitutional: Negative for appetite change and fatigue.  Musculoskeletal: Negative for myalgias.  Neurological: Positive for headaches.  Psychiatric/Behavioral: Positive for decreased concentration and depression. Negative for suicidal ideas. The patient does not have insomnia.     Vitals:   Vitals:   01/06/19 0821  BP: 120/72  Pulse: 78  Temp: 98.7 F (37.1 C)  TempSrc: Oral  SpO2: 98%  Weight: 279 lb 6.1 oz (126.7 kg)  Height: 5' 10.5" (1.791 m)     Body mass index is 39.52 kg/m.   Physical Exam:    Physical Exam Vitals signs and nursing note reviewed.  Constitutional:      General: He is not irritable.He is not in acute distress.    Appearance: He is well-developed. He is not ill-appearing or toxic-appearing.  Cardiovascular:     Rate and Rhythm: Normal rate and regular rhythm.     Pulses: Normal pulses.     Heart sounds: Normal heart sounds, S1 normal and S2 normal.     Comments: No LE edema Pulmonary:     Effort: Pulmonary effort is normal.     Breath sounds: Normal breath sounds.  Skin:    General: Skin is warm and dry.  Neurological:     Mental Status: He is alert.     GCS: GCS eye subscore is 4. GCS verbal subscore is 5. GCS motor subscore is 6.  Psychiatric:        Speech: Speech normal.        Behavior: Behavior normal. Behavior is cooperative.      Assessment and Plan:    Curt was seen today for depression.  Diagnoses and all orders for this visit:  Screening examination for STD (sexually transmitted disease) -     HIV Antibody (routine testing w rflx) -     RPR -     Urine cytology ancillary only([place])  Dysthymia Well controlled. Follow-up in 3-6 months, sooner if needed. I discussed with patient that if they develop any SI, to tell someone immediately and seek medical attention.   Other orders -     buPROPion (WELLBUTRIN XL) 300 MG 24 hr tablet; Take 1 tablet (300 mg total) by mouth daily.    . Reviewed expectations re:  course of current medical issues. . Discussed self-management of symptoms. . Outlined signs and symptoms indicating need for more acute intervention. . Patient verbalized understanding and all questions were answered. . See orders for this visit as documented in the electronic medical  record. . Patient received an After Visit Summary.  CMA or LPN served as scribe during this visit. History, Physical, and Plan performed by medical provider. The above documentation has been reviewed and is accurate and complete.   Jarold Motto, PA-C [place] 01/06/2019  Follow-up: No follow-ups on file.

## 2019-01-06 NOTE — Patient Instructions (Signed)
It was great to see you!  We will contact you with your lab and urine results.  If you have change in mood, please follow-up with [place]. If severe changes, please go to the ER.  Let's follow-up in 3-6 months, sooner if you have concerns.  Take care,  Jarold Motto PA-C

## 2019-01-07 LAB — HIV ANTIBODY (ROUTINE TESTING W REFLEX): HIV 1&2 Ab, 4th Generation: NONREACTIVE

## 2019-01-07 LAB — URINE CYTOLOGY ANCILLARY ONLY
CHLAMYDIA, DNA PROBE: NEGATIVE
Neisseria Gonorrhea: NEGATIVE
TRICH (WINDOWPATH): NEGATIVE

## 2019-01-07 LAB — RPR: RPR Ser Ql: NONREACTIVE

## 2019-01-18 ENCOUNTER — Other Ambulatory Visit: Payer: Self-pay | Admitting: *Deleted

## 2019-01-18 MED ORDER — BUPROPION HCL ER (XL) 300 MG PO TB24: 300.0000 mg | ORAL_TABLET | Freq: Every day | ORAL | 1 refills | Status: DC

## 2019-06-08 IMAGING — CT CT RENAL STONE PROTOCOL
2 of 3 series · 17 of 46 positions shown, 19 images · non-contrast
Comparison: None.

CLINICAL DATA: Left abdominal pain

EXAM:
CT ABDOMEN AND PELVIS WITHOUT CONTRAST
TECHNIQUE: Multidetector CT imaging of the abdomen and pelvis was performed
following the standard protocol without IV contrast.

[Series 4: lung · axial · 0.90mm/px · z∈[-191,-21]mm · 14 of 99 slices shown, 16 images]
[im 7/99  soft-tissue]
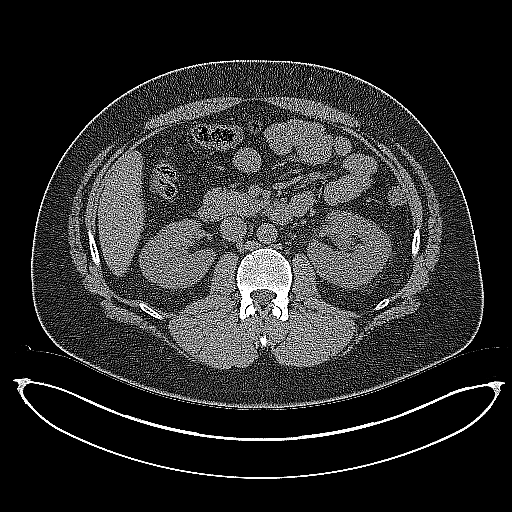
[im 7/99  bone]
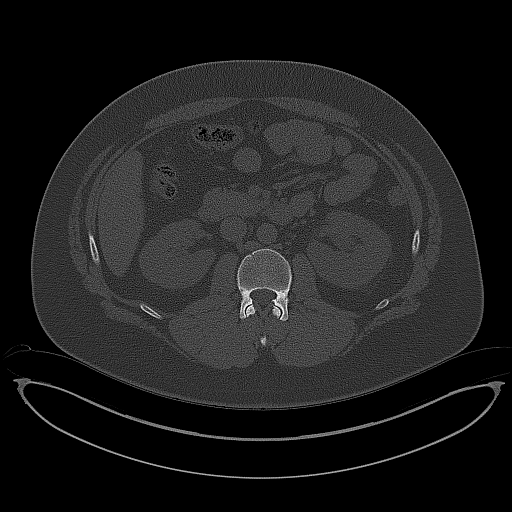
[im 13/99  soft-tissue]
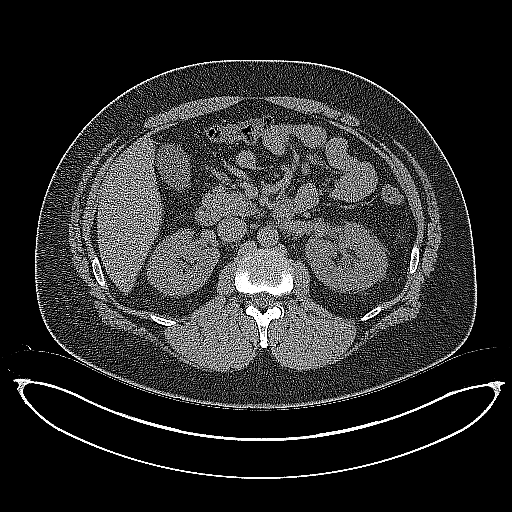
[im 19/99  soft-tissue]
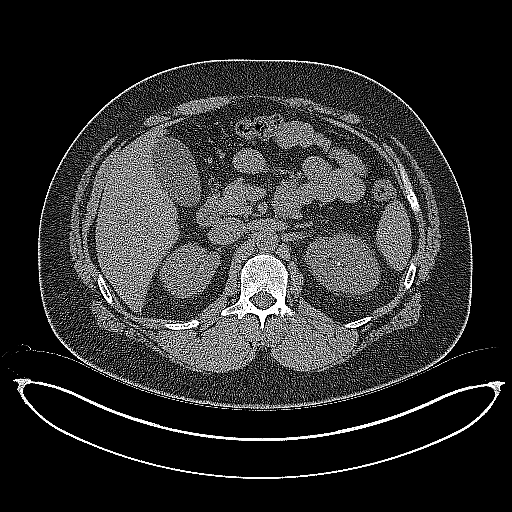
[im 26/99  soft-tissue]
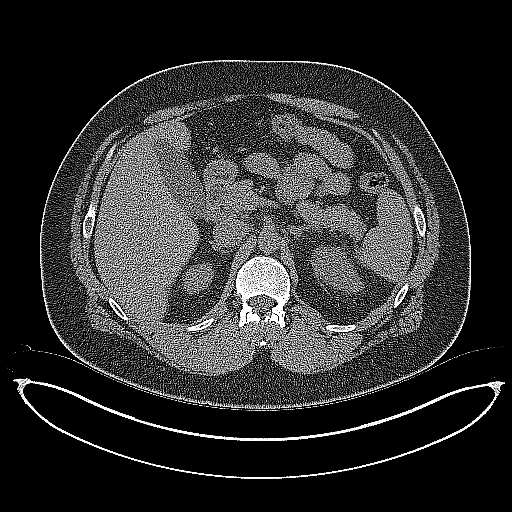
[im 32/99  soft-tissue]
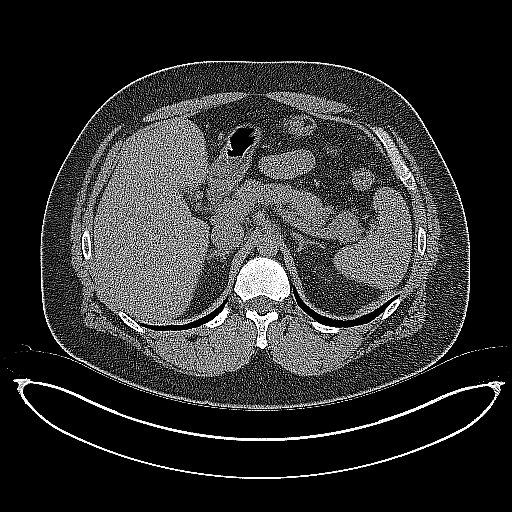
[im 38/99  soft-tissue]
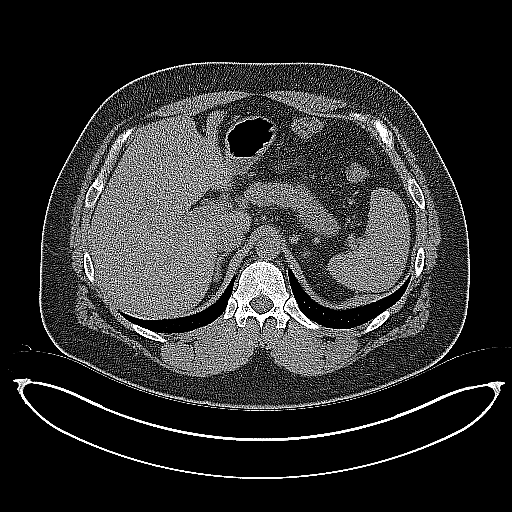
[im 45/99  soft-tissue]
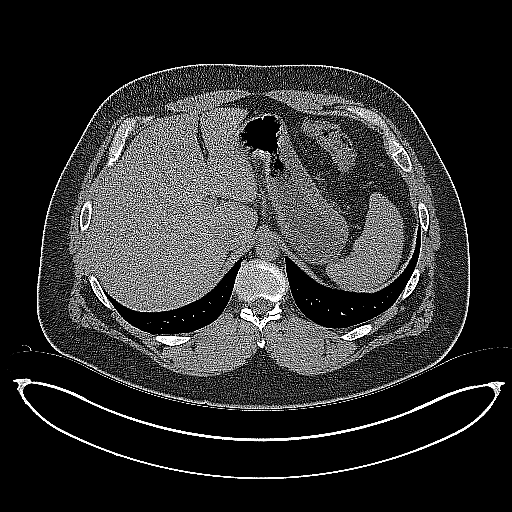
[im 54/99  soft-tissue]
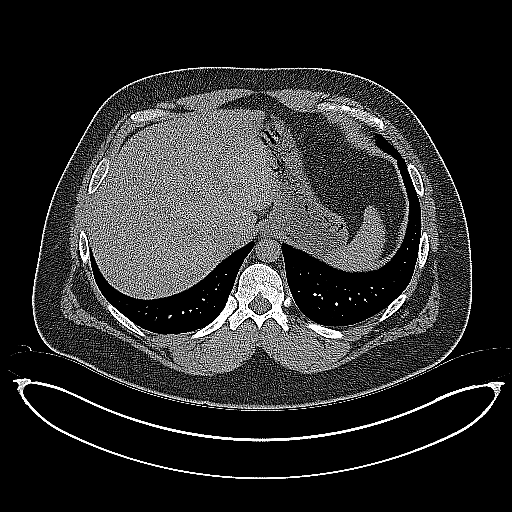
[im 61/99  soft-tissue]
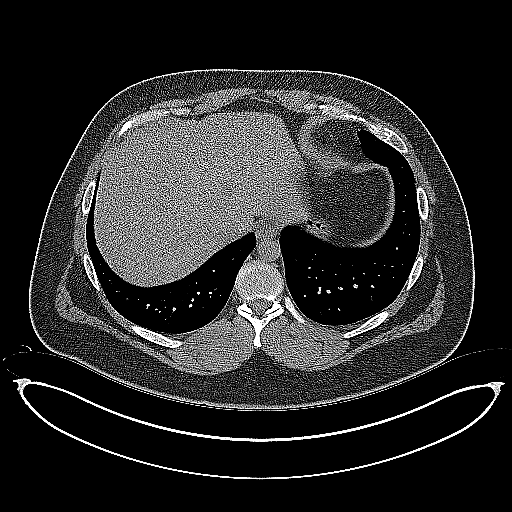
[im 61/99  bone]
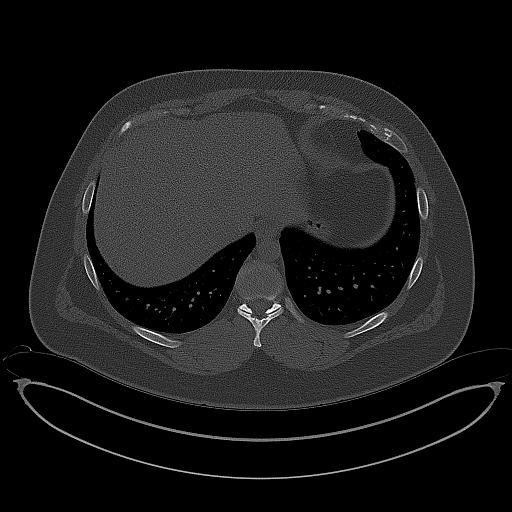
[im 67/99  soft-tissue]
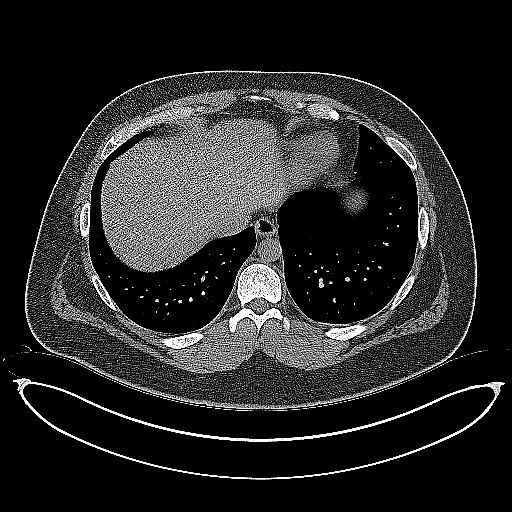
[im 73/99  soft-tissue]
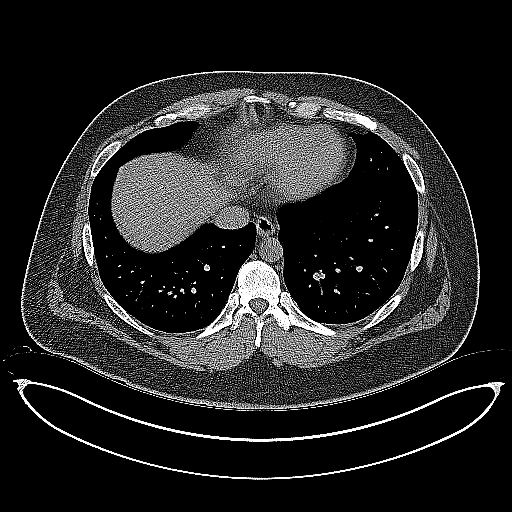
[im 80/99  soft-tissue]
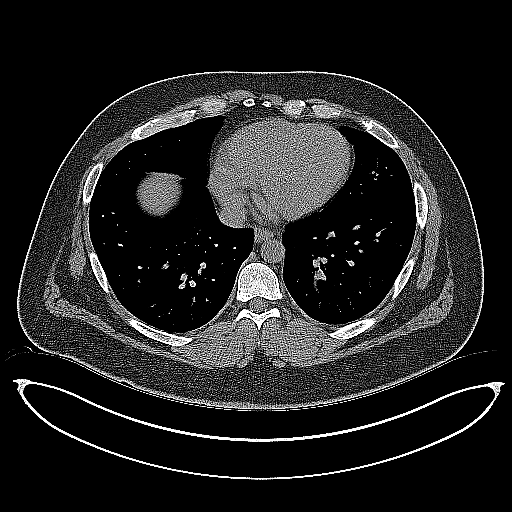
[im 86/99  soft-tissue]
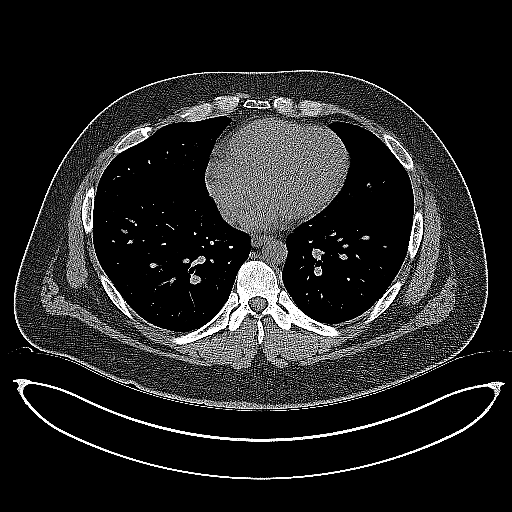
[im 92/99  soft-tissue]
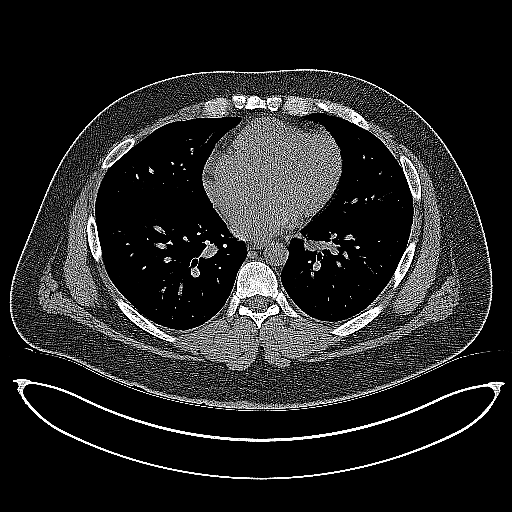

[Series 5: coronal · coronal · 0.85mm/px · 3 of 153 slices shown]
[im 51/153  soft-tissue]
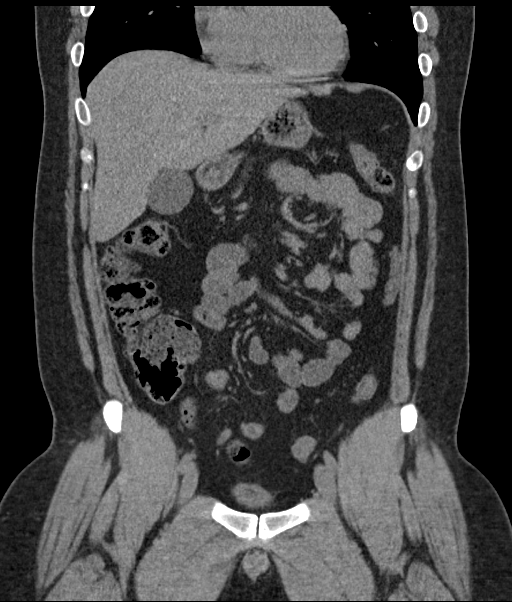
[im 68/153  soft-tissue]
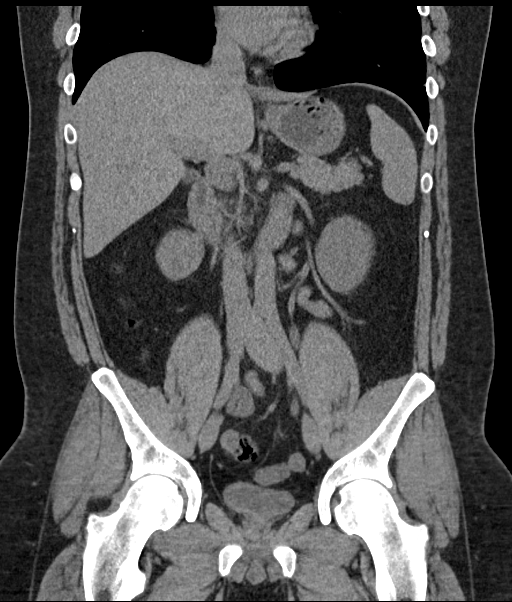
[im 85/153  soft-tissue]
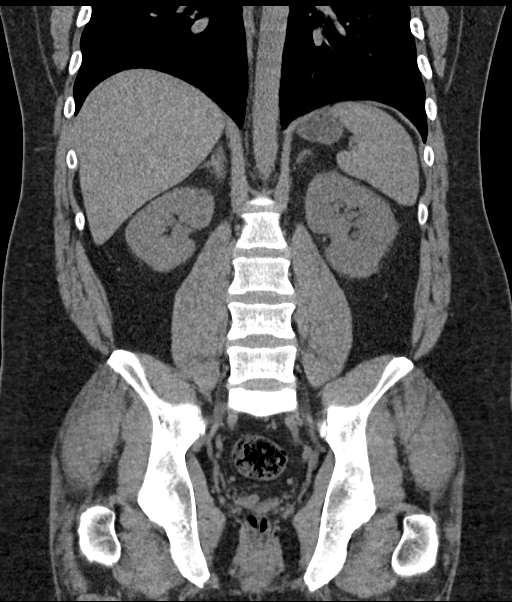

[17 of 46 positions shown; findings below may reference images not displayed]

FINDINGS: Lower chest: Lung bases are clear. No effusions. Heart is normal
size.

Hepatobiliary: No focal hepatic abnormality. Gallbladder
unremarkable.

Pancreas: No focal abnormality or ductal dilatation.

Spleen: No focal abnormality.  Normal size.

Adrenals/Urinary Tract: Adrenal glands unremarkable. Punctate
nonobstructing bilateral renal stones. Mild left hydronephrosis due
to a punctate 2 mm distal left ureteral stone. Urinary bladder
decompressed.

Stomach/Bowel: Stomach, large and small bowel grossly unremarkable.

Vascular/Lymphatic: No evidence of aneurysm or adenopathy.

Reproductive: No visible focal abnormality.

Other: No free fluid or free air.

Musculoskeletal: No acute bony abnormality.
IMPRESSION: 2 mm distal left ureteral stone with mild left hydronephrosis.

Bilateral punctate nephrolithiasis.

## 2019-06-25 ENCOUNTER — Other Ambulatory Visit: Payer: Self-pay | Admitting: Family Medicine

## 2019-10-01 ENCOUNTER — Telehealth: Payer: Self-pay | Admitting: Physician Assistant

## 2019-10-01 ENCOUNTER — Encounter: Payer: Self-pay | Admitting: Physician Assistant

## 2019-10-01 ENCOUNTER — Ambulatory Visit (INDEPENDENT_AMBULATORY_CARE_PROVIDER_SITE_OTHER): Payer: Managed Care, Other (non HMO) | Admitting: Physician Assistant

## 2019-10-01 VITALS — BP 124/70 | HR 72 | Temp 98.7°F | Ht 70.5 in | Wt 267.0 lb

## 2019-10-01 DIAGNOSIS — S29011A Strain of muscle and tendon of front wall of thorax, initial encounter: Secondary | ICD-10-CM | POA: Diagnosis not present

## 2019-10-01 DIAGNOSIS — R079 Chest pain, unspecified: Secondary | ICD-10-CM

## 2019-10-01 LAB — COMPREHENSIVE METABOLIC PANEL
ALT: 21 U/L (ref 0–53)
AST: 13 U/L (ref 0–37)
Albumin: 4.3 g/dL (ref 3.5–5.2)
Alkaline Phosphatase: 93 U/L (ref 39–117)
BUN: 12 mg/dL (ref 6–23)
CO2: 28 mEq/L (ref 19–32)
Calcium: 9.3 mg/dL (ref 8.4–10.5)
Chloride: 106 mEq/L (ref 96–112)
Creatinine, Ser: 0.94 mg/dL (ref 0.40–1.50)
GFR: 92.93 mL/min (ref >60.00)
Glucose, Bld: 76 mg/dL (ref 70–99)
Potassium: 4.5 mEq/L (ref 3.5–5.1)
Sodium: 144 mEq/L (ref 135–145)
Total Bilirubin: 0.5 mg/dL (ref 0.2–1.2)
Total Protein: 6.9 g/dL (ref 6.0–8.3)

## 2019-10-01 LAB — CBC WITH DIFFERENTIAL/PLATELET
Basophils Absolute: 0.1 10*3/uL (ref 0.0–0.1)
Basophils Relative: 0.7 % (ref 0.0–3.0)
Eosinophils Absolute: 0.2 10*3/uL (ref 0.0–0.7)
Eosinophils Relative: 2 % (ref 0.0–5.0)
HCT: 44.1 % (ref 39.0–52.0)
Hemoglobin: 15.1 g/dL (ref 13.0–17.0)
Lymphocytes Relative: 25.4 % (ref 12.0–46.0)
Lymphs Abs: 2.2 10*3/uL (ref 0.7–4.0)
MCHC: 34.2 g/dL (ref 30.0–36.0)
MCV: 91.1 fl (ref 78.0–100.0)
Monocytes Absolute: 0.7 10*3/uL (ref 0.1–1.0)
Monocytes Relative: 7.6 % (ref 3.0–12.0)
Neutro Abs: 5.6 10*3/uL (ref 1.4–7.7)
Neutrophils Relative %: 64.3 % (ref 43.0–77.0)
Platelets: 235 10*3/uL (ref 150.0–400.0)
RBC: 4.84 Mil/uL (ref 4.22–5.81)
RDW: 13.9 % (ref 11.5–15.5)
WBC: 8.8 10*3/uL (ref 4.0–10.5)

## 2019-10-01 LAB — LIPID PANEL
Cholesterol: 174 mg/dL (ref 0–200)
HDL: 34.9 mg/dL — ABNORMAL LOW (ref >39.00)
LDL Cholesterol: 115 mg/dL — ABNORMAL HIGH (ref 0–99)
NonHDL: 139.03
Total CHOL/HDL Ratio: 5
Triglycerides: 119 mg/dL (ref 0.0–149.0)
VLDL: 23.8 mg/dL (ref 0.0–40.0)

## 2019-10-01 MED ORDER — BUSPIRONE HCL 5 MG PO TABS: 5.0000 mg | ORAL_TABLET | Freq: Three times a day (TID) | ORAL | 0 refills | Status: DC | PRN

## 2019-10-01 MED ORDER — ONDANSETRON HCL 4 MG PO TABS: 4.0000 mg | ORAL_TABLET | Freq: Three times a day (TID) | ORAL | 0 refills | Status: DC | PRN

## 2019-10-01 MED ORDER — MELOXICAM 15 MG PO TABS: 15.0000 mg | ORAL_TABLET | Freq: Every day | ORAL | 0 refills | Status: DC

## 2019-10-01 NOTE — Progress Notes (Signed)
Nicholas Decker is a 33 y.o. male here for a new problem.    History of Present Illness:   Chief Complaint  Patient presents with  . Shoulder Pain    HPI   Shoulder pain Pt c/o left shoulder pain off and on x 8 months worse in the past month.  Patient reports that he is having pain in this area with exertion, especially with pulling and lifting.  He also states that at times it is pulsing.  He took some time off last week and rested, and the pain was actually at its worst then.  The highest the pain gets is around 6 out of 10.  He has not taken anything for his symptoms. He does a lot of lifting at work, repetitive-motion type activity.  Chest pain Also endorses L-sided chest pain with activity. Does not feel like it is severe. Goes away with rest. Denies SOB, lower leg swelling, radiation of pain.  He did state that his brother died from heart complications and he was very young.  Does have significant anxiety and feels as though this could be contributing to both problems.  He does not smoke.   Past Medical History:  Diagnosis Date  . Seasonal allergies      Social History   Socioeconomic History  . Marital status: Married    Spouse name: Not on file  . Number of children: Not on file  . Years of education: Not on file  . Highest education level: Not on file  Occupational History  . Not on file  Social Needs  . Financial resource strain: Not on file  . Food insecurity    Worry: Not on file    Inability: Not on file  . Transportation needs    Medical: Not on file    Non-medical: Not on file  Tobacco Use  . Smoking status: Never Smoker  . Smokeless tobacco: Never Used  Substance and Sexual Activity  . Alcohol use: Yes    Comment: Social maybe twice a year  . Drug use: No  . Sexual activity: Yes    Partners: Female  Lifestyle  . Physical activity    Days per week: Not on file    Minutes per session: Not on file  . Stress: Not on file  Relationships  .  Social Herbalist on phone: Not on file    Gets together: Not on file    Attends religious service: Not on file    Active member of club or organization: Not on file    Attends meetings of clubs or organizations: Not on file    Relationship status: Not on file  . Intimate partner violence    Fear of current or ex partner: Not on file    Emotionally abused: Not on file    Physically abused: Not on file    Forced sexual activity: Not on file  Other Topics Concern  . Not on file  Social History Narrative  . Not on file    History reviewed. No pertinent surgical history.  Family History  Problem Relation Age of Onset  . Cancer Father   . Heart disease Maternal Grandmother   . Heart disease Maternal Grandfather   . Cancer Paternal Grandfather     No Known Allergies  Current Medications:   Current Outpatient Medications:  .  buPROPion (WELLBUTRIN XL) 300 MG 24 hr tablet, TAKE 1 TABLET DAILY, Disp: 90 tablet, Rfl: 0 .  cetirizine (ZYRTEC)  10 MG tablet, Take 10 mg by mouth daily., Disp: , Rfl:  .  busPIRone (BUSPAR) 5 MG tablet, Take 1 tablet (5 mg total) by mouth 3 (three) times daily as needed., Disp: 90 tablet, Rfl: 0 .  meloxicam (MOBIC) 15 MG tablet, Take 1 tablet (15 mg total) by mouth daily., Disp: 30 tablet, Rfl: 0 .  ondansetron (ZOFRAN) 4 MG tablet, Take 1 tablet (4 mg total) by mouth every 8 (eight) hours as needed for nausea or vomiting., Disp: 20 tablet, Rfl: 0   Review of Systems:   ROS Negative unless otherwise specified per HPI.  Vitals:   Vitals:   10/01/19 1031  BP: 124/70  Pulse: 72  Temp: 98.7 F (37.1 C)  TempSrc: Temporal  SpO2: 98%  Weight: 267 lb (121.1 kg)  Height: 5' 10.5" (1.791 m)     Body mass index is 37.77 kg/m.  Physical Exam:   Physical Exam Vitals signs and nursing note reviewed.  Constitutional:      General: He is not in acute distress.    Appearance: He is well-developed. He is not ill-appearing or  toxic-appearing.  HENT:     Head: Normocephalic and atraumatic.  Cardiovascular:     Rate and Rhythm: Normal rate and regular rhythm.     Heart sounds: Normal heart sounds.  Pulmonary:     Effort: Pulmonary effort is normal. No accessory muscle usage or respiratory distress.     Breath sounds: Normal breath sounds.  Musculoskeletal:     Comments: No reproducible chest wall pain.   With internal/external rotation of L arm, has slight discomfort to L pec muscle.  Skin:    General: Skin is warm and dry.  Neurological:     Mental Status: He is alert.  Psychiatric:        Speech: Speech normal.        Behavior: Behavior is cooperative.      Assessment and Plan:   Masin was seen today for shoulder pain.  Diagnoses and all orders for this visit:  Left-sided chest pain EKG tracing is personally reviewed.  EKG notes NSR.  No acute changes.  I did recommend that we go to cardiology, however he is concerned about finances with this.  I did recommend that we check his labs today as well as updated lipid panel to see if there is anything else going on.  I did discuss worsening precautions including continued worsening chest pain, crushing chest pain, diaphoresis, severe headache, or any other concerning symptoms to immediately proceed to the ER.  Patient declined cardiology referral today.  He states that he will let us know if he would like to pursue this. -     EKG 12-Lead -     CBC with Differential/Platelet -     Comprehensive metabolic panel -     Lipid panel  Strain of left pectoralis muscle, initial encounter We will trial Mobic for anti-inflammation.  We will also send to Dr. Lavada Mesi for further evaluation and treatment.  He is not really able to change his level of physical activity at work, however could potentially send to physical therapy to come up with better lifting mechanics. -     Ambulatory referral to Orthopedics  Other orders -     meloxicam (MOBIC) 15 MG  tablet; Take 1 tablet (15 mg total) by mouth daily.  . Reviewed expectations re: course of current medical issues. . Discussed self-management of symptoms. . Outlined signs and symptoms indicating  need for more acute intervention. . Patient verbalized understanding and all questions were answered. . See orders for this visit as documented in the electronic medical record. . Patient received an After-Visit Summary.  CMA or LPN served as scribe during this visit. History, Physical, and Plan performed by medical provider. The above documentation has been reviewed and is accurate and complete.  Inda Coke, PA-C

## 2019-10-01 NOTE — Patient Instructions (Signed)
It was great to see you!  For your shoulder: Try ice or heating pad. Start daily mobic (this has been sent in for you) -- do not take aleve or ibuprofen with this. Follow-up with Dr. Junius Roads -- I have put in a referral for you to see him, he is a sports medicine provider in [facility].  For your heart: Pay attention to your symptoms, if they change in any way, go to the ER. I would like to send you to cardiology, please consider this and let me know.  We will be in touch with lab results.  Take care,  Inda Coke PA-C

## 2019-10-01 NOTE — Telephone Encounter (Signed)
Pt stated he picked up rx for meloxicam (MOBIC) 15 MG tablet  However after he left pharmacy they called him about two more rxs that came in busPIRone (BUSPAR) 5 MG tablet and ondansetron (ZOFRAN) 4 MG tablet  Pt wants to make sure he was not supposed have those medications as they were not discussed. Requesting CB only if he is supposed to take them.

## 2019-10-07 ENCOUNTER — Ambulatory Visit: Payer: Managed Care, Other (non HMO) | Admitting: Family Medicine

## 2020-01-11 ENCOUNTER — Telehealth: Payer: Self-pay | Admitting: Physician Assistant

## 2020-01-11 MED ORDER — BUPROPION HCL ER (XL) 300 MG PO TB24: 300.0000 mg | ORAL_TABLET | Freq: Every day | ORAL | 0 refills | Status: DC

## 2020-01-11 NOTE — Telephone Encounter (Signed)
Patient called in for a medicine refill for [facility] 300MG  and his new pharmacy is [address] - [place], [place]

## 2020-01-11 NOTE — Telephone Encounter (Signed)
Told pt Rx sent to pharmacy as requested but was only given 30 day supply until your appt with [facility]. Pt verbalized understanding.

## 2020-02-02 ENCOUNTER — Ambulatory Visit (INDEPENDENT_AMBULATORY_CARE_PROVIDER_SITE_OTHER): Payer: Managed Care, Other (non HMO) | Admitting: Physician Assistant

## 2020-02-02 ENCOUNTER — Encounter: Payer: Self-pay | Admitting: Physician Assistant

## 2020-02-02 DIAGNOSIS — F341 Dysthymic disorder: Secondary | ICD-10-CM

## 2020-02-02 DIAGNOSIS — R41840 Attention and concentration deficit: Secondary | ICD-10-CM | POA: Diagnosis not present

## 2020-02-02 MED ORDER — BUPROPION HCL ER (XL) 300 MG PO TB24: ORAL_TABLET | ORAL | 1 refills | Status: DC

## 2020-02-02 NOTE — Progress Notes (Signed)
Virtual Visit via Video   I connected with Nicholas Decker on 02/02/20 at  9:40 AM EST by a video enabled telemedicine application and verified that I am speaking with the correct person using two identifiers. Location patient: Home Location provider: [facility], Office Persons participating in the virtual visit: Breton Berns, Inda Coke PA-C, Anselmo Pickler, LPN   I discussed the limitations of evaluation and management by telemedicine and the availability of in person appointments. The patient expressed understanding and agreed to proceed.  I acted as a Education administrator for [employer], PA-C [employer], LPN  Subjective:   HPI:   Pt is transferring care today from Dr. Juleen Decker.  Depression Pt following up, currently taking Wellbutrin 300 mg daily. Tolerating medication well. No SI/HI. Has difficulty remembering to take medications, and recently went a few days without this medication. Denies any SI/HI.  Depression screen [facility] 2/9 02/02/2020 01/06/2019 09/01/2018 09/17/2017 09/17/2017  Decreased Interest 1 1 1 2  0  Down, Depressed, Hopeless 1 1 1 1  0  PHQ - 2 Score 2 2 2 3  0  Altered sleeping 3 1 2 3  -  Tired, decreased energy 3 2 2 3  -  Change in appetite 0 0 0 1 -  Feeling bad or failure about yourself  1 1 1 2  -  Trouble concentrating 3 3 3 3  -  Moving slowly or fidgety/restless 1 0 0 1 -  Suicidal thoughts 0 1 1 0 -  PHQ-9 Score 13 10 11 16  -  Difficult doing work/chores Somewhat difficult Not difficult at all Somewhat difficult Not difficult at all -   No flowsheet data found.  Attention deficit Has concerns that he may have ADD or ADHD. Has difficulty multi-tasking and initiating calling friends and family. Feels like his relationships are being affected.   ROS: See pertinent positives and negatives per HPI.  Patient Active Problem List   Diagnosis Date Noted  . Dysthymia 01/06/2019  . Obesity (BMI 30-39.9) 06/22/2017    Social History   Tobacco Use  .  Smoking status: Never Smoker  . Smokeless tobacco: Never Used  Substance Use Topics  . Alcohol use: Yes    Comment: Social maybe twice a year    Current Outpatient Medications:  .  buPROPion (WELLBUTRIN XL) 300 MG 24 hr tablet, Take one tablet daily, Disp: 90 tablet, Rfl: 1  No Known Allergies  Objective:   VITALS: Per patient if applicable, see vitals. GENERAL: Alert, appears well and in no acute distress. HEENT: Atraumatic, conjunctiva clear, no obvious abnormalities on inspection of external nose and ears. NECK: Normal movements of the head and neck. CARDIOPULMONARY: No increased WOB. Speaking in clear sentences. I:E ratio WNL.  MS: Moves all visible extremities without noticeable abnormality. PSYCH: Pleasant and cooperative, well-groomed. Speech normal rate and rhythm. Affect is appropriate. Insight and judgement are appropriate. Attention is focused, linear, and appropriate.  NEURO: CN grossly intact. Oriented as arrived to appointment on time with no prompting. Moves both UE equally.  SKIN: No obvious lesions, wounds, erythema, or cyanosis noted on face or hands.  Assessment and Plan:   Maanav was seen today for transfer of care and depression.  Diagnoses and all orders for this visit:  Attention deficit Referral to [facility] for further evaluation/management. -     Ambulatory referral to Psychiatry  Dysthymia Stable. Continue Wellbutrin XL 300 mg daily. I discussed with patient that if they develop any SI, to tell someone immediately and seek medical attention.  Follow-up in 6 months, sooner if concerns.  Other orders -     buPROPion (WELLBUTRIN XL) 300 MG 24 hr tablet; Take one tablet daily  . Reviewed expectations re: course of current medical issues. . Discussed self-management of symptoms. . Outlined signs and symptoms indicating need for more acute intervention. . Patient verbalized understanding and all questions were answered. [facility]  Maintenance issues including appropriate healthy diet, exercise, and smoking avoidance were discussed with patient. . See orders for this visit as documented in the electronic medical record.  I discussed the assessment and treatment plan with the patient. The patient was provided an opportunity to ask questions and all were answered. The patient agreed with the plan and demonstrated an understanding of the instructions.   The patient was advised to call back or seek an in-person evaluation if the symptoms worsen or if the condition fails to improve as anticipated.   CMA or LPN served as scribe during this visit. History, Physical, and Plan performed by medical provider. The above documentation has been reviewed and is accurate and complete.  [place], [place] 02/02/2020

## 2020-05-19 ENCOUNTER — Telehealth: Payer: Self-pay | Admitting: Physician Assistant

## 2020-05-19 NOTE — Telephone Encounter (Signed)
Stephanie can you look into this please. 

## 2020-05-19 NOTE — Telephone Encounter (Signed)
Patient calling, he was referred to and ADHD specialist and they do not take his insurance. Please Advise .jk

## 2020-06-21 NOTE — Telephone Encounter (Signed)
I am not sure how I missed this message - I am so sorry.  He needs to contact his insurance company to see who they recommend him seeing - and what they cover.  [facility] takes his insurance, he just has to pay for the testing which is $200.  Im looking at different websites, and they all say to contact the insurance company to check benefits and what they will cover.

## 2020-06-21 NOTE — Telephone Encounter (Signed)
Spoke to pt told him sorry our referral coordinator just got back to me and she said  you need to contact your insurance company to see who they recommend you see - and what they cover.  [facility] takes his insurance, he just has to pay for the testing which is $200.  Im looking at different websites, and they all say to contact the insurance company to check benefits and what they will cover. Pt verbalized understanding and will contact his insurance company and get back to [place].

## 2021-09-11 ENCOUNTER — Encounter: Payer: Self-pay | Admitting: Physician Assistant

## 2022-05-30 ENCOUNTER — Telehealth (INDEPENDENT_AMBULATORY_CARE_PROVIDER_SITE_OTHER): Payer: Managed Care, Other (non HMO) | Admitting: Physician Assistant

## 2022-05-30 ENCOUNTER — Encounter: Payer: Self-pay | Admitting: Physician Assistant

## 2022-05-30 VITALS — Ht 70.5 in | Wt 280.0 lb

## 2022-05-30 DIAGNOSIS — R109 Unspecified abdominal pain: Secondary | ICD-10-CM | POA: Diagnosis not present

## 2022-05-30 MED ORDER — IBUPROFEN 600 MG PO TABS: 600.0000 mg | ORAL_TABLET | Freq: Three times a day (TID) | ORAL | 0 refills | Status: AC | PRN

## 2022-05-30 MED ORDER — TAMSULOSIN HCL 0.4 MG PO CAPS: 0.4000 mg | ORAL_CAPSULE | Freq: Every day | ORAL | 0 refills | Status: DC

## 2022-05-30 NOTE — Progress Notes (Signed)
   I acted as a Neurosurgeon for [employer], PA-C Corky Mull, LPN  Virtual Visit via Video Note   I, Jarold Motto, connected with  Nicholas Decker  ([id number], 29-Oct-1986) on 05/30/22 at 11:20 AM EDT by a video-enabled telemedicine application and verified that I am speaking with the correct person using two identifiers.  Location: Patient: Home Provider: [facility] [place] office   I discussed the limitations of evaluation and management by telemedicine and the availability of in person appointments. The patient expressed understanding and agreed to proceed.    History of Present Illness: Nicholas Decker is a 36 y.o. who identifies as a male who was assigned male at birth, and is being seen today for Flank pain. Pt is c/o dull pain right flank area since 3:30 AM. Feels like he needs to have a BM and having constant, dull "grinding" pain.  Patient said on Tues urine was brown pushed fluids, but urine is still light pink color.   Hx kidney stones that presented this same way. Pushing fluids. Had some flu-like symptoms -- chills and a runny nose.  Denies: fever, changes in appetite, burning in penis, discharge from penis, pain near scrotum, STDs  Problems:  Patient Active Problem List   Diagnosis Date Noted   Dysthymia 01/06/2019   Obesity (BMI 30-39.9) 06/22/2017    Allergies: No Known Allergies Medications: No current outpatient medications on file.  Observations/Objective: Patient is well-developed, well-nourished in no acute distress.  Resting comfortably  at home.  Head is normocephalic, atraumatic.  No labored breathing.  Speech is clear and coherent with logical content.  Patient is alert and oriented at baseline.    Assessment and Plan: 1. Flank pain No red flags Suspect possible kidney stone as presentation is similar to past episodes Recommend flomax daily and hydration I have also sent in 600 mg ibuprofen for pain/inflammation Low threshold for in  office eval if symptoms worsen or do not improve  Follow Up Instructions: I discussed the assessment and treatment plan with the patient. The patient was provided an opportunity to ask questions and all were answered. The patient agreed with the plan and demonstrated an understanding of the instructions.  A copy of instructions were sent to the patient via MyChart unless otherwise noted below.   The patient was advised to call back or seek an in-person evaluation if the symptoms worsen or if the condition fails to improve as anticipated.  Jarold Motto, [place]

## 2022-09-23 ENCOUNTER — Encounter: Payer: Self-pay | Admitting: *Deleted

## 2022-12-12 ENCOUNTER — Encounter: Payer: Self-pay | Admitting: *Deleted

## 2023-03-06 ENCOUNTER — Ambulatory Visit (INDEPENDENT_AMBULATORY_CARE_PROVIDER_SITE_OTHER): Payer: Managed Care, Other (non HMO) | Admitting: Physician Assistant

## 2023-03-06 ENCOUNTER — Encounter: Payer: Self-pay | Admitting: Physician Assistant

## 2023-03-06 VITALS — BP 120/76 | HR 85 | Temp 97.8°F | Ht 70.5 in | Wt 302.4 lb

## 2023-03-06 DIAGNOSIS — Z0001 Encounter for general adult medical examination with abnormal findings: Secondary | ICD-10-CM

## 2023-03-06 DIAGNOSIS — E669 Obesity, unspecified: Secondary | ICD-10-CM | POA: Diagnosis not present

## 2023-03-06 DIAGNOSIS — F341 Dysthymic disorder: Secondary | ICD-10-CM | POA: Diagnosis not present

## 2023-03-06 DIAGNOSIS — Z1159 Encounter for screening for other viral diseases: Secondary | ICD-10-CM

## 2023-03-06 DIAGNOSIS — R1013 Epigastric pain: Secondary | ICD-10-CM

## 2023-03-06 LAB — COMPREHENSIVE METABOLIC PANEL
ALT: 27 U/L (ref 0–53)
AST: 17 U/L (ref 0–37)
Albumin: 3.9 g/dL (ref 3.5–5.2)
Alkaline Phosphatase: 94 U/L (ref 39–117)
BUN: 12 mg/dL (ref 6–23)
CO2: 26 mEq/L (ref 19–32)
Calcium: 9.2 mg/dL (ref 8.4–10.5)
Chloride: 106 mEq/L (ref 96–112)
Creatinine, Ser: 0.86 mg/dL (ref 0.40–1.50)
GFR: 112.15 mL/min (ref >60.00)
Glucose, Bld: 85 mg/dL (ref 70–99)
Potassium: 4.2 mEq/L (ref 3.5–5.1)
Sodium: 139 mEq/L (ref 135–145)
Total Bilirubin: 0.5 mg/dL (ref 0.2–1.2)
Total Protein: 6.8 g/dL (ref 6.0–8.3)

## 2023-03-06 LAB — CBC WITH DIFFERENTIAL/PLATELET
Basophils Absolute: 0.1 10*3/uL (ref 0.0–0.1)
Basophils Relative: 0.8 % (ref 0.0–3.0)
Eosinophils Absolute: 0.1 10*3/uL (ref 0.0–0.7)
Eosinophils Relative: 2.1 % (ref 0.0–5.0)
HCT: 46 % (ref 39.0–52.0)
Hemoglobin: 15.5 g/dL (ref 13.0–17.0)
Lymphocytes Relative: 22.7 % (ref 12.0–46.0)
Lymphs Abs: 1.4 10*3/uL (ref 0.7–4.0)
MCHC: 33.7 g/dL (ref 30.0–36.0)
MCV: 91.6 fl (ref 78.0–100.0)
Monocytes Absolute: 0.8 10*3/uL (ref 0.1–1.0)
Monocytes Relative: 12.5 % — ABNORMAL HIGH (ref 3.0–12.0)
Neutro Abs: 3.9 10*3/uL (ref 1.4–7.7)
Neutrophils Relative %: 61.9 % (ref 43.0–77.0)
Platelets: 208 10*3/uL (ref 150.0–400.0)
RBC: 5.02 Mil/uL (ref 4.22–5.81)
RDW: 13.7 % (ref 11.5–15.5)
WBC: 6.3 10*3/uL (ref 4.0–10.5)

## 2023-03-06 LAB — LIPID PANEL
Cholesterol: 166 mg/dL (ref 0–200)
HDL: 34.7 mg/dL — ABNORMAL LOW (ref >39.00)
LDL Cholesterol: 116 mg/dL — ABNORMAL HIGH (ref 0–99)
NonHDL: 131.06
Total CHOL/HDL Ratio: 5
Triglycerides: 73 mg/dL (ref 0.0–149.0)
VLDL: 14.6 mg/dL (ref 0.0–40.0)

## 2023-03-06 MED ORDER — SERTRALINE HCL 25 MG PO TABS: 25.0000 mg | ORAL_TABLET | Freq: Every day | ORAL | 3 refills | Status: DC

## 2023-03-06 MED ORDER — SUCRALFATE 1 G PO TABS: 1.0000 g | ORAL_TABLET | Freq: Three times a day (TID) | ORAL | 0 refills | Status: DC

## 2023-03-06 MED ORDER — HYDROXYZINE HCL 10 MG PO TABS: 10.0000 mg | ORAL_TABLET | Freq: Three times a day (TID) | ORAL | 0 refills | Status: DC | PRN

## 2023-03-06 NOTE — Patient Instructions (Addendum)
It was great to see you!  If you develop suicidal thoughts, please tell someone and immediately proceed to our local 24/7 crisis center, [facility] at the [facility]. [address], [place], [place] [address] [phone].  Start zoloft 25 mg daily Tell someone you trust about starting this -- if it makes you suicidal, please stop this and go to the ER or the above urgent care May take 10 mg hydroxyzine as needed for panic attacks -- may increase up to 20 mg if needed If any non-life-threatening concerns with these meds -- send me a mychart message!  Let's do virtual follow-up in 2-4 weeks to discuss these medications  Start daily over the counter medication for your stomach such as prilosec or pepcid (GENERIC IS FINE) -- do a two week course -- this will reduce the acid content in your stomach Also start carafate 4 times daily to help heal any stomach irritation that you have  Look at handout for resources for talk therapy Also reach out to your insurance to see who is covered and make a plan to get connected with a therapist

## 2023-03-06 NOTE — Progress Notes (Signed)
Subjective:    Nicholas Decker is a 37 y.o. male and is here for a comprehensive physical exam.  Depression        Associated symptoms include no myalgias and no headaches.   Health Maintenance Due  Topic Date Due   Hepatitis C Screening  Never done    Acute Concerns: Mood/Depression  He has not been taking Wellbutrin for about 2 years due to cost and feeling as though it was not working.  He endorses suicidal ideation but denies any desire to follow through.  He denies having a plan.  He states that his job tends to effect his mood and he has started at a new location yesterday and is hopeful that this will help his symptoms/mood. His fiance is part of his support system.  He also endorses being easily irritable.  He previously did therapy but is currently hesitant due to costs.  Panic attacks He has been having panic attacks for a while but notes they have recently worsened.  He reports recent family life stressors that contribute to his anxiety.   Chronic Issues: Epigastric pain He reports ongoing pain for at least one year Has pain when he eats Denies n/v Has baseline liquidy stools and goes about 4-6 x a day Denies excessive alcohol intake or NSAID/ASA use Denies black stools or bright red blood  Health Maintenance: Immunizations -- UTD Colonoscopy -- n/a PSA --  Lab Results  Component Value Date   PSA 0.36 06/17/2017   Diet -- varied Exercise -- none  Weight -- Weight: (!) 302 lb 6.1 oz (137.2 kg)  Recent weight history Wt Readings from Last 10 Encounters:  03/06/23 (!) 302 lb 6.1 oz (137.2 kg)  05/30/22 280 lb (127 kg)  10/01/19 267 lb (121.1 kg)  01/06/19 279 lb 6.1 oz (126.7 kg)  09/01/18 276 lb 3.2 oz (125.3 kg)  02/24/18 277 lb 9.6 oz (125.9 kg)  11/30/17 270 lb (122.5 kg)  09/17/17 284 lb (128.8 kg)  06/17/17 272 lb 6.4 oz (123.6 kg)   Body mass index is 42.77 kg/m.  Mood -- see above Alcohol use --  reports current alcohol use.   Tobacco use --  Tobacco Use: Low Risk  (03/06/2023)   Patient History    Smoking Tobacco Use: Never    Smokeless Tobacco Use: Never    Passive Exposure: Not on file    Eligible for Low Dose CT? No  UTD with eye doctor? yes UTD with dentist? yes     03/06/2023   10:01 AM  Depression screen PHQ 2/9  Decreased Interest 3  Down, Depressed, Hopeless 3  PHQ - 2 Score 6  Altered sleeping 3  Tired, decreased energy 3  Change in appetite 3  Feeling bad or failure about yourself  3  Trouble concentrating 3  Moving slowly or fidgety/restless 2  Suicidal thoughts 3  PHQ-9 Score 26  Difficult doing work/chores Extremely dIfficult    Other providers/specialists: Patient Care Team: Inda Coke, [place] as PCP - General (Physician Assistant)    PMHx, SurgHx, SocialHx, Medications, and Allergies were reviewed in the Visit Navigator and updated as appropriate.   Past Medical History:  Diagnosis Date   Seasonal allergies     History reviewed. No pertinent surgical history.   Family History  Problem Relation Age of Onset   Depression Mother    Cancer Father        either throat or lung; mets to brain   Cancer Brother 50  pancreatic   Heart disease Maternal Grandmother    Heart disease Maternal Grandfather    Cancer Paternal Grandfather     Social History   Tobacco Use   Smoking status: Never   Smokeless tobacco: Never  Vaping Use   Vaping Use: Never used  Substance Use Topics   Alcohol use: Yes    Comment: Social maybe twice a year   Drug use: No    Review of Systems:   Review of Systems  Constitutional:  Negative for chills, fever, malaise/fatigue and weight loss.  HENT:  Negative for hearing loss, sinus pain and sore throat.   Respiratory:  Negative for cough and hemoptysis.   Cardiovascular:  Negative for chest pain, palpitations, leg swelling and PND.  Gastrointestinal:  Negative for abdominal pain, constipation, diarrhea, heartburn, nausea and  vomiting.  Genitourinary:  Negative for dysuria, frequency and urgency.  Musculoskeletal:  Negative for back pain, myalgias and neck pain.  Skin:  Negative for itching and rash.  Neurological:  Negative for dizziness, tingling, seizures and headaches.  Endo/Heme/Allergies:  Negative for polydipsia.  Psychiatric/Behavioral:  Positive for depression. The patient is not nervous/anxious.     Objective:    Vitals:   03/06/23 0957  BP: 120/76  Pulse: 85  Temp: 97.8 F (36.6 C)  SpO2: 99%    Body mass index is 42.77 kg/m.  General  Alert, cooperative, no distress, appears stated age  Head:  Normocephalic, without obvious abnormality, atraumatic  Eyes:  PERRL, conjunctiva/corneas clear, EOM's intact, fundi benign, both eyes       Ears:  Normal TM's and external ear canals, both ears  Nose: Nares normal, septum midline, mucosa normal, no drainage or sinus tenderness  Throat: Lips, mucosa, and tongue normal; teeth and gums normal  Neck: Supple, symmetrical, trachea midline, no adenopathy;     thyroid:  No enlargement/tenderness/nodules; no carotid bruit or JVD  Back:   Symmetric, no curvature, ROM normal, no CVA tenderness  Lungs:   Clear to auscultation bilaterally, respirations unlabored  Chest wall:  No tenderness or deformity  Heart:  Regular rate and rhythm, S1 and S2 normal, no murmur, rub or gallop  Abdomen:   Soft, non-tender, bowel sounds active all four quadrants, no masses, no organomegaly  Extremities: Extremities normal, atraumatic, no cyanosis or edema  Prostate : Deferred    Skin: Skin color, texture, turgor normal, no rashes or lesions  Lymph nodes: Cervical, supraclavicular, and axillary nodes normal  Neurologic: CNII-XII grossly intact. Normal strength, sensation and reflexes throughout   AssessmentPlan:   Encounter for general adult medical examination with abnormal findings Today patient counseled on age appropriate routine health concerns for screening and  prevention, each reviewed and up to date or declined. Immunizations reviewed and up to date or declined. Labs ordered and reviewed. Risk factors for depression reviewed and negative. Hearing function and visual acuity are intact. ADLs screened and addressed as needed. Functional ability and level of safety reviewed and appropriate. Education, counseling and referrals performed based on assessed risks today. Patient provided with a copy of personalized plan for preventive services.  Encounter for screening for other viral diseases Update Hep C today  Epigastric pain No evidence of acute abdomen or obvious red flags Possible PUD or gastritis  Recommend the following Start daily over the counter medication for your stomach such as prilosec or pepcid (GENERIC IS FINE) -- do a two week course -- this will reduce the acid content in your stomach Also start carafate 4  times daily to help heal any stomach irritation that you have Consider GI referral if persists  Dysthymia Uncontrolled Denies active suicidal plan in our office today Advised as follows: Start zoloft 25 mg daily Tell someone you trust about starting this -- if it makes you suicidal, please stop this and go to the ER or the above urgent care May take 10 mg hydroxyzine as needed for panic attacks -- may increase up to 20 mg if needed If any non-life-threatening concerns with these meds -- send me a mychart message! Let's do virtual follow-up in 2-4 weeks to discuss these medications  Obesity, unspecified classification, unspecified obesity type, unspecified whether serious comorbidity present Continue healthy lifestyle efforts    Inda Coke, PA-C [place]

## 2023-03-07 LAB — HEPATITIS C ANTIBODY: Hepatitis C Ab: NONREACTIVE

## 2023-03-27 ENCOUNTER — Telehealth: Payer: Managed Care, Other (non HMO) | Admitting: Physician Assistant

## 2023-03-27 ENCOUNTER — Encounter: Payer: Self-pay | Admitting: Physician Assistant

## 2023-03-27 VITALS — Ht 70.5 in | Wt 302.0 lb

## 2023-03-27 DIAGNOSIS — R1013 Epigastric pain: Secondary | ICD-10-CM | POA: Diagnosis not present

## 2023-03-27 DIAGNOSIS — F341 Dysthymic disorder: Secondary | ICD-10-CM | POA: Diagnosis not present

## 2023-03-27 MED ORDER — SERTRALINE HCL 50 MG PO TABS: 50.0000 mg | ORAL_TABLET | Freq: Every day | ORAL | 1 refills | Status: DC

## 2023-03-27 NOTE — Progress Notes (Signed)
   I acted as a Education administrator for [employer], PA-C Anselmo Pickler, LPN  Virtual Visit via Video Note   I, Inda Coke, PA, connected with  Nicholas Decker  ([id number], 01/20/1986) on 03/27/23 at 10:00 AM EDT by a video-enabled telemedicine application and verified that I am speaking with the correct person using two identifiers.  Location: Patient: Home Provider: [place] office   I discussed the limitations of evaluation and management by telemedicine and the availability of in person appointments. The patient expressed understanding and agreed to proceed.    History of Present Illness: Nicholas Decker is a 37 y.o. who identifies as a male who was assigned male at birth, and is being seen today for anxiety / depression. Pt currently taking Hydroxyzine 10 mg TID PRN and Zoloft 25 mg daily tolerating medications. Denies any concerns with side effects of medication. Denies SI/HI. Has had less intrusive thoughts. Has not had to try hydroxyzine. 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  Epigastric pain Feeling a bit better since he was last here Denies diarrhea, rectal bleeding, n/v Taking the omeprazole daily and tolerating well  Problems:  Patient Active Problem List   Diagnosis Date Noted   Dysthymia 01/06/2019   Obesity (BMI 30-39.9) 06/22/2017    Allergies: No Known Allergies Medications:  Current Outpatient Medications:    DENTA 5000 PLUS 1.1 % CREA dental cream, Place 1 Application onto teeth at bedtime., Disp: , Rfl:    hydrOXYzine (ATARAX) 10 MG tablet, Take 1 tablet (10 mg total) by mouth 3 (three) times daily as needed., Disp: 30 tablet, Rfl: 0   ibuprofen (ADVIL) 600 MG tablet, Take 1 tablet (600 mg total) by mouth every 8 (eight) hours as needed., Disp: 30 tablet, Rfl: 0   sertraline (ZOLOFT) 25 MG tablet, Take 1 tablet (25 mg total) by mouth daily., Disp: 30 tablet, Rfl: 3  Observations/Objective: Patient is well-developed,  well-nourished in no acute distress.  Resting comfortably  at home.  Head is normocephalic, atraumatic.  No labored breathing.  Speech is clear and coherent with logical content.  Patient is alert and oriented at baseline.   Assessment and Plan: 1. Dysthymia Improving Increase zoloft to 50 mg daily Denies SI/HI Follow-up in 3 months, sooner if concerns I discussed with patient that if they develop any SI, to tell someone immediately and seek medical attention.  2. Epigastric pain Improving He declines GI referral Will continue omeprazole  Follow-up if new/worsening sx or need for GI referral  Follow Up Instructions: I discussed the assessment and treatment plan with the patient. The patient was provided an opportunity to ask questions and all were answered. The patient agreed with the plan and demonstrated an understanding of the instructions.  A copy of instructions were sent to the patient via MyChart unless otherwise noted below.   The patient was advised to call back or seek an in-person evaluation if the symptoms worsen or if the condition fails to improve as anticipated.  Inda Coke, [place]

## 2023-06-23 ENCOUNTER — Ambulatory Visit: Payer: Managed Care, Other (non HMO) | Admitting: Family Medicine

## 2023-06-23 ENCOUNTER — Encounter: Payer: Self-pay | Admitting: Family Medicine

## 2023-06-23 VITALS — BP 114/77 | HR 77 | Temp 97.3°F | Ht 70.0 in | Wt 295.6 lb

## 2023-06-23 DIAGNOSIS — R1013 Epigastric pain: Secondary | ICD-10-CM

## 2023-06-23 DIAGNOSIS — R059 Cough, unspecified: Secondary | ICD-10-CM

## 2023-06-23 DIAGNOSIS — F341 Dysthymic disorder: Secondary | ICD-10-CM

## 2023-06-23 MED ORDER — SERTRALINE HCL 50 MG PO TABS: 50.0000 mg | ORAL_TABLET | Freq: Every day | ORAL | 0 refills | Status: DC

## 2023-06-23 MED ORDER — PANTOPRAZOLE SODIUM 40 MG PO TBEC: 40.0000 mg | DELAYED_RELEASE_TABLET | Freq: Every day | ORAL | 0 refills | Status: DC

## 2023-06-23 NOTE — Patient Instructions (Addendum)
It was very nice to see you today!  I think your cough is probably coming from reflux or inflammation in your stomach.  Your lungs sound clear today.  Please try the Protonix for the next few weeks.  Let us know if not proving.  He can also increase her dose of Zoloft.  Please follow-up with [facility] soon.  Return in about 2 weeks (around 07/07/2023).   Take care, Dr Jimmey Ralph  PLEASE NOTE:  If you had any lab tests, please let us know if you have not heard back within a few days. You may see your results on mychart before we have a chance to review them but we will give you a call once they are reviewed by [place].   If we ordered any referrals today, please let us know if you have not heard from their office within the next week.   If you had any urgent prescriptions sent in today, please check with the pharmacy within an hour of our visit to make sure the prescription was transmitted appropriately.   Please try these tips to maintain a healthy lifestyle:  Eat at least 3 REAL meals and 1-2 snacks per day.  Aim for no more than 5 hours between eating.  If you eat breakfast, please do so within one hour of getting up.   Each meal should contain half fruits/vegetables, one quarter protein, and one quarter carbs (no bigger than a computer mouse)  Cut down on sweet beverages. This includes juice, soda, and sweet tea.   Drink at least 1 glass of water with each meal and aim for at least 8 glasses per day  Exercise at least 150 minutes every week.

## 2023-06-23 NOTE — Progress Notes (Signed)
   Nicholas Decker is a 37 y.o. male who presents today for an office visit.  Assessment/Plan:  New/Acute Problems: Cough No red flags.  Reassuring exam today.  Symptoms been going on for the last month or so.  He is having some epigastric pain with eating and may have some underlying GERD or PUD which is likely contributing to his cough.  Will be treating this as below with a course of PPI.  No signs or symptoms consistent with postnasal drip.  If he does not have any improvement with cough with PPI would consider imaging with chest x-ray or CT scan at that point.  We discussed reasons to return to care.  Epigastric Pain Concern for possible PUD or GERD.  It does also possibly he may have a hiatal hernia however this was not observed on CT scan about 6 years ago.  Will be treating with Protonix 40 mg daily for a few weeks.  If not improving will need referral to GI.  Chronic Problems Addressed Today: Dysthymia   No red flags.  No SI or HI.  On Zoloft 50 mg daily.  Doing reasonably well with this however he would like to increase the dose.  Will send in refill today.  Instructed patient is okay for him to get 200 mg daily.  We discussed potential side effects.  He will follow-up with his PCP soon.     Subjective:  HPI:  See assessment / plan for status of chronic conditions.  Main concern today is cough for the last month or so. This happens randomly. Sometimes causes him to have an episode of vomiting. This happened twice this morning. Cough is dry cough. No fevers or chills. No heartburn or reflux but sometimes gets a burning sensation in abdomen when eating. No regurgication. No dysphagia. Symptoms seem to be getting worse. No rhinorrhea.  No post nasal drip.   His PCP did start him on sertraline about 3 months ago for dysthymia.  This has done well however he does feel like the dose could be a little bit stronger.       Objective:  Physical Exam: BP 114/77   Pulse 77   Temp (!)  97.3 F (36.3 C) (Temporal)   Ht 5\' 10"  (1.778 m)   Wt 295 lb 9.6 oz (134.1 kg)   SpO2 97%   BMI 42.41 kg/m   Gen: No acute distress, resting comfortably CV: Regular rate and rhythm with no murmurs appreciated Pulm: Normal work of breathing, clear to auscultation bilaterally with no crackles, wheezes, or rhonchi Abdomen: Soft, nontender, nondistended. Neuro: Grossly normal, moves all extremities Psych: Normal affect and thought content      Joretta Eads M. Jimmey Ralph, MD 06/23/2023 11:21 AM

## 2023-07-19 ENCOUNTER — Other Ambulatory Visit: Payer: Self-pay | Admitting: Family Medicine

## 2023-07-21 ENCOUNTER — Other Ambulatory Visit: Payer: Self-pay | Admitting: Family Medicine

## 2023-10-16 ENCOUNTER — Ambulatory Visit: Payer: Managed Care, Other (non HMO) | Admitting: Family Medicine

## 2023-10-16 ENCOUNTER — Encounter: Payer: Self-pay | Admitting: Family Medicine

## 2023-10-16 DIAGNOSIS — R9089 Other abnormal findings on diagnostic imaging of central nervous system: Secondary | ICD-10-CM

## 2023-10-16 DIAGNOSIS — R1030 Lower abdominal pain, unspecified: Secondary | ICD-10-CM | POA: Diagnosis not present

## 2023-10-16 LAB — CBC WITH DIFFERENTIAL/PLATELET
Basophils Absolute: 0.1 10*3/uL (ref 0.0–0.1)
Basophils Relative: 0.8 % (ref 0.0–3.0)
Eosinophils Absolute: 0.1 10*3/uL (ref 0.0–0.7)
Eosinophils Relative: 1.7 % (ref 0.0–5.0)
HCT: 45.6 % (ref 39.0–52.0)
Hemoglobin: 14.9 g/dL (ref 13.0–17.0)
Lymphocytes Relative: 25.6 % (ref 12.0–46.0)
Lymphs Abs: 1.9 10*3/uL (ref 0.7–4.0)
MCHC: 32.6 g/dL (ref 30.0–36.0)
MCV: 92.9 fL (ref 78.0–100.0)
Monocytes Absolute: 0.5 10*3/uL (ref 0.1–1.0)
Monocytes Relative: 7.1 % (ref 3.0–12.0)
Neutro Abs: 4.9 10*3/uL (ref 1.4–7.7)
Neutrophils Relative %: 64.8 % (ref 43.0–77.0)
Platelets: 227 10*3/uL (ref 150.0–400.0)
RBC: 4.91 Mil/uL (ref 4.22–5.81)
RDW: 14 % (ref 11.5–15.5)
WBC: 7.5 10*3/uL (ref 4.0–10.5)

## 2023-10-16 LAB — COMPREHENSIVE METABOLIC PANEL
ALT: 27 U/L (ref 0–53)
AST: 26 U/L (ref 0–37)
Albumin: 4.2 g/dL (ref 3.5–5.2)
Alkaline Phosphatase: 105 U/L (ref 39–117)
BUN: 9 mg/dL (ref 6–23)
CO2: 29 meq/L (ref 19–32)
Calcium: 9.2 mg/dL (ref 8.4–10.5)
Chloride: 105 meq/L (ref 96–112)
Creatinine, Ser: 0.96 mg/dL (ref 0.40–1.50)
GFR: 101.94 mL/min (ref >60.00)
Glucose, Bld: 89 mg/dL (ref 70–99)
Potassium: 4.1 meq/L (ref 3.5–5.1)
Sodium: 143 meq/L (ref 135–145)
Total Bilirubin: 0.4 mg/dL (ref 0.2–1.2)
Total Protein: 7.4 g/dL (ref 6.0–8.3)

## 2023-10-16 LAB — URINALYSIS, ROUTINE W REFLEX MICROSCOPIC
Bilirubin Urine: NEGATIVE
Hgb urine dipstick: NEGATIVE
Ketones, ur: NEGATIVE
Leukocytes,Ua: NEGATIVE
Nitrite: NEGATIVE
Specific Gravity, Urine: 1.025 (ref 1.000–1.030)
Total Protein, Urine: NEGATIVE
Urine Glucose: NEGATIVE
Urobilinogen, UA: 0.2 (ref 0.0–1.0)
pH: 6 (ref 5.0–8.0)
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
## 2023-10-20 ENCOUNTER — Other Ambulatory Visit: Payer: Managed Care, Other (non HMO)

## 2023-10-28 ENCOUNTER — Other Ambulatory Visit: Payer: Self-pay | Admitting: *Deleted

## 2023-10-28 MED ORDER — SERTRALINE HCL 50 MG PO TABS: 50.0000 mg | ORAL_TABLET | Freq: Every day | ORAL | 2 refills | Status: DC

## 2023-11-29 ENCOUNTER — Other Ambulatory Visit: Payer: Self-pay | Admitting: Physician Assistant

## 2023-12-22 ENCOUNTER — Other Ambulatory Visit: Payer: Self-pay | Admitting: Physician Assistant

## 2024-04-09 ENCOUNTER — Encounter: Payer: Self-pay | Admitting: Physician Assistant

## 2024-04-09 ENCOUNTER — Other Ambulatory Visit: Payer: Self-pay | Admitting: Physician Assistant

## 2024-04-09 ENCOUNTER — Ambulatory Visit: Admitting: Physician Assistant

## 2024-04-09 VITALS — BP 110/80 | HR 95 | Ht 70.0 in | Wt 312.2 lb

## 2024-04-09 DIAGNOSIS — F419 Anxiety disorder, unspecified: Secondary | ICD-10-CM | POA: Diagnosis not present

## 2024-04-09 DIAGNOSIS — R5383 Other fatigue: Secondary | ICD-10-CM | POA: Diagnosis not present

## 2024-04-09 DIAGNOSIS — F341 Dysthymic disorder: Secondary | ICD-10-CM

## 2024-04-09 DIAGNOSIS — R29818 Other symptoms and signs involving the nervous system: Secondary | ICD-10-CM

## 2024-04-09 DIAGNOSIS — E559 Vitamin D deficiency, unspecified: Secondary | ICD-10-CM | POA: Diagnosis not present

## 2024-04-09 DIAGNOSIS — K219 Gastro-esophageal reflux disease without esophagitis: Secondary | ICD-10-CM

## 2024-04-09 DIAGNOSIS — Z809 Family history of malignant neoplasm, unspecified: Secondary | ICD-10-CM

## 2024-04-09 DIAGNOSIS — Z6841 Body Mass Index (BMI) 40.0 and over, adult: Secondary | ICD-10-CM

## 2024-04-09 DIAGNOSIS — R0602 Shortness of breath: Secondary | ICD-10-CM

## 2024-04-09 DIAGNOSIS — E669 Obesity, unspecified: Secondary | ICD-10-CM

## 2024-04-09 LAB — LIPID PANEL
Cholesterol: 200 mg/dL (ref 0–200)
HDL: 38.3 mg/dL — ABNORMAL LOW (ref >39.00)
LDL Cholesterol: 136 mg/dL — ABNORMAL HIGH (ref 0–99)
NonHDL: 161.76
Total CHOL/HDL Ratio: 5
Triglycerides: 127 mg/dL (ref 0.0–149.0)
VLDL: 25.4 mg/dL (ref 0.0–40.0)

## 2024-04-09 LAB — CBC WITH DIFFERENTIAL/PLATELET
Basophils Absolute: 0 10*3/uL (ref 0.0–0.1)
Basophils Relative: 0.4 % (ref 0.0–3.0)
Eosinophils Absolute: 0.2 10*3/uL (ref 0.0–0.7)
Eosinophils Relative: 2.5 % (ref 0.0–5.0)
HCT: 46.5 % (ref 39.0–52.0)
Hemoglobin: 15.6 g/dL (ref 13.0–17.0)
Lymphocytes Relative: 27.3 % (ref 12.0–46.0)
Lymphs Abs: 2.1 10*3/uL (ref 0.7–4.0)
MCHC: 33.5 g/dL (ref 30.0–36.0)
MCV: 92.6 fl (ref 78.0–100.0)
Monocytes Absolute: 0.6 10*3/uL (ref 0.1–1.0)
Monocytes Relative: 8.3 % (ref 3.0–12.0)
Neutro Abs: 4.8 10*3/uL (ref 1.4–7.7)
Neutrophils Relative %: 61.5 % (ref 43.0–77.0)
Platelets: 233 10*3/uL (ref 150.0–400.0)
RBC: 5.02 Mil/uL (ref 4.22–5.81)
RDW: 14 % (ref 11.5–15.5)
WBC: 7.8 10*3/uL (ref 4.0–10.5)

## 2024-04-09 LAB — COMPREHENSIVE METABOLIC PANEL WITH GFR
ALT: 33 U/L (ref 0–53)
AST: 19 U/L (ref 0–37)
Albumin: 4.6 g/dL (ref 3.5–5.2)
Alkaline Phosphatase: 99 U/L (ref 39–117)
BUN: 13 mg/dL (ref 6–23)
CO2: 28 meq/L (ref 19–32)
Calcium: 9 mg/dL (ref 8.4–10.5)
Chloride: 102 meq/L (ref 96–112)
Creatinine, Ser: 0.89 mg/dL (ref 0.40–1.50)
GFR: 110.14 mL/min (ref >60.00)
Glucose, Bld: 93 mg/dL (ref 70–99)
Potassium: 4.4 meq/L (ref 3.5–5.1)
Sodium: 140 meq/L (ref 135–145)
Total Bilirubin: 0.5 mg/dL (ref 0.2–1.2)
Total Protein: 7.2 g/dL (ref 6.0–8.3)

## 2024-04-09 LAB — VITAMIN B12: Vitamin B-12: 242 pg/mL (ref 211–911)

## 2024-04-09 LAB — TSH: TSH: 2.35 u[IU]/mL (ref 0.35–5.50)

## 2024-04-09 LAB — VITAMIN D 25 HYDROXY (VIT D DEFICIENCY, FRACTURES): VITD: 12.63 ng/mL — ABNORMAL LOW (ref 30.00–100.00)

## 2024-04-09 LAB — HEMOGLOBIN A1C: Hgb A1c MFr Bld: 5.4 % (ref 4.6–6.5)

## 2024-04-09 MED ORDER — VITAMIN D (ERGOCALCIFEROL) 1.25 MG (50000 UNIT) PO CAPS: 50000.0000 [IU] | ORAL_CAPSULE | ORAL | 0 refills | Status: DC

## 2024-04-09 NOTE — Patient Instructions (Signed)
 It was great to see you!  Trial hydroxyzine in future if needed; if no improvement, lets consider an inhaler (just message me)  If psychiatry is considering mirtazapine/Remeron -- let them know about your overeating issues  I will be in touch with all results and recommendations  Referral today for sleep study and genetic counselor  Let's follow-up in 3-6 months for Comprehensive Physical Exam (CPE) preventive care annual visit, sooner if you have concerns.  Take care,  Jarold Motto PA-C

## 2024-04-09 NOTE — Progress Notes (Signed)
 Nicholas Decker is a 38 y.o. male here for a follow up of a pre-existing problem.  History of Present Illness:   Chief Complaint  Patient presents with   Anxiety    Pt has been having trouble with his anxiety and when he gets anxious about something he can't catch his breath at times.   Insomnia    Pt c/o trouble sleeping for a long time. Psychiatrist would like him to have a home sleep study and check Vit D.    HPI  Anxiety / Depression: Pt reports episodes of anxiety with associated shortness of breath and "panicking".  He recalls having an episode while trying to look for his Pantoprazole after losing it.  Episodes do not tend to last long and resolve when pt focuses on calming down.  He is complaint with Zoloft 100 mg BID and Hydroxyzine 10 mg as needed.  Pt is not taking Hydroxyzine three times daily; does not usually take Hydroxyzine when he is at home.  Pt is established with psychiatry, follows up remotely.  He is in the process of finding a new therapist. Currently he is on his 2nd week of a 2 month mental health medical leave.  Pt does not smoke/vape, but notes his wife does vape.  No unusual lower extremity edema.  No hx of asthma or COPD.   Insomnia: Pt reports poor sleep quality and sleeping for long periods of time.  Pt will next follow up with is psychiatrist on 4/22 to discuss potentially starting Mirtazapine.  Pt does snore and previously had a home sleep study done in 2017/2018.  Results were negative for sleep apnea.   Past Medical History:  Diagnosis Date   Seasonal allergies      Social History   Tobacco Use   Smoking status: Never   Smokeless tobacco: Never  Vaping Use   Vaping status: Never Used  Substance Use Topics   Alcohol use: Yes    Comment: Social maybe twice a year   Drug use: No    No past surgical history on file.  Family History  Problem Relation Age of Onset   Depression Mother    Thyroid disease Mother    Cancer Father         either throat or lung; mets to brain   Cancer Brother 61       pancreatic   Heart disease Maternal Grandmother    Heart disease Maternal Grandfather    Cancer Paternal Grandfather     No Known Allergies  Current Medications:   Current Outpatient Medications:    cetirizine (ZYRTEC) 10 MG tablet, Take 10 mg by mouth daily., Disp: , Rfl:    DENTA 5000 PLUS 1.1 % CREA dental cream, Place 1 Application onto teeth at bedtime., Disp: , Rfl:    hydrOXYzine (ATARAX) 10 MG tablet, TAKE 1 TABLET BY MOUTH THREE TIMES A DAY AS NEEDED, Disp: 30 tablet, Rfl: 0   ibuprofen (ADVIL) 600 MG tablet, Take 1 tablet (600 mg total) by mouth every 8 (eight) hours as needed., Disp: 30 tablet, Rfl: 0   pantoprazole (PROTONIX) 40 MG tablet, TAKE 1 TABLET BY MOUTH DAILY, Disp: 30 tablet, Rfl: 5   sertraline (ZOLOFT) 100 MG tablet, Take 100 mg by mouth 2 (two) times daily., Disp: , Rfl:    Review of Systems:   Negative unless otherwise specified per HPI.  Vitals:   Vitals:   04/09/24 0909  BP: 110/80  Pulse: 95  SpO2: 97%  Weight: Marland Kitchen)  312 lb 4 oz (141.6 kg)  Height: 5\' 10"  (1.778 m)     Body mass index is 44.8 kg/m.  Physical Exam:   Physical Exam Vitals and nursing note reviewed.  Constitutional:      General: He is not in acute distress.    Appearance: He is well-developed. He is not ill-appearing or toxic-appearing.  Cardiovascular:     Rate and Rhythm: Normal rate and regular rhythm.     Pulses: Normal pulses.     Heart sounds: Normal heart sounds, S1 normal and S2 normal.  Pulmonary:     Effort: Pulmonary effort is normal.     Breath sounds: Normal breath sounds.  Skin:    General: Skin is warm and dry.  Neurological:     Mental Status: He is alert.     GCS: GCS eye subscore is 4. GCS verbal subscore is 5. GCS motor subscore is 6.  Psychiatric:        Speech: Speech normal.        Behavior: Behavior normal. Behavior is cooperative.     Assessment and Plan:   Gastroesophageal  reflux disease, unspecified whether esophagitis present (Primary) Well-controlled with 40 mg pantoprazole daily Follow-up in 3 to 6 months for CPE, sooner if concerns  Dysthymia; Anxiety Uncontrolled Continues to work closely with psychiatry and working to find a new [place] per specialist I discussed with patient that if they develop any SI, to tell someone immediately and seek medical attention.  SOB (shortness of breath) Unclear etiology Recommend continued management of anxiety Also recommend referral to sleep studies for further evaluation of suspected sleep apnea Consider Zio patch, chest x-ray, pulmonary in the future Will update blood work today for any possible organic causes of symptoms If any worsening symptoms or new symptoms in the meantime I asked him to let me know  Suspected sleep apnea - Ambulatory referral to Sleep Studies  Obesity, unspecified class, unspecified obesity type, unspecified whether serious comorbidity present He is motivated to work on better eating Continue to work on self-care as able We will also update his blood work today - CBC with Differential/Platelet - Comprehensive metabolic panel with GFR - Hemoglobin A1c - Lipid panel  Fatigue, unspecified type Likely multifactorial due to mental health issues Continue efforts at self-care Follow-up based on clinical symptoms and results - Hemoglobin A1c - Vitamin B12  Family history of cancer Referral to genetics - Ambulatory referral to Genetics  Vitamin D deficiency Update vitamin D and provide recommendations accordingly - VITAMIN D 25 Hydroxy (Vit-D Deficiency, Fractures)    I, Isabelle Course, acting as a Neurosurgeon for [employer], [place]., have documented all relevant documentation on the behalf of Jarold Motto, [place], as directed by  Jarold Motto, PA while in the presence of Jarold Motto, [place].  I, Jarold Motto, [place], have reviewed all documentation for this  visit. The documentation on 04/09/24 for the exam, diagnosis, procedures, and orders are all accurate and complete.  Jarold Motto, PA-C

## 2024-04-29 ENCOUNTER — Other Ambulatory Visit: Payer: Self-pay | Admitting: Physician Assistant

## 2024-04-29 MED ORDER — ALBUTEROL SULFATE HFA 108 (90 BASE) MCG/ACT IN AERS: 2.0000 | INHALATION_SPRAY | Freq: Four times a day (QID) | RESPIRATORY_TRACT | 0 refills | Status: AC | PRN

## 2024-05-19 ENCOUNTER — Encounter: Payer: Self-pay | Admitting: Neurology

## 2024-05-19 ENCOUNTER — Ambulatory Visit (INDEPENDENT_AMBULATORY_CARE_PROVIDER_SITE_OTHER): Admitting: Neurology

## 2024-05-19 VITALS — BP 139/90 | HR 119 | Ht 71.0 in | Wt 326.0 lb

## 2024-05-19 DIAGNOSIS — Z9189 Other specified personal risk factors, not elsewhere classified: Secondary | ICD-10-CM | POA: Diagnosis not present

## 2024-05-19 DIAGNOSIS — R519 Headache, unspecified: Secondary | ICD-10-CM | POA: Diagnosis not present

## 2024-05-19 DIAGNOSIS — R0683 Snoring: Secondary | ICD-10-CM

## 2024-05-19 DIAGNOSIS — R351 Nocturia: Secondary | ICD-10-CM | POA: Diagnosis not present

## 2024-05-19 DIAGNOSIS — R635 Abnormal weight gain: Secondary | ICD-10-CM

## 2024-05-19 DIAGNOSIS — R0681 Apnea, not elsewhere classified: Secondary | ICD-10-CM | POA: Diagnosis not present

## 2024-05-19 DIAGNOSIS — G4719 Other hypersomnia: Secondary | ICD-10-CM

## 2024-05-19 DIAGNOSIS — Z6841 Body Mass Index (BMI) 40.0 and over, adult: Secondary | ICD-10-CM

## 2024-05-19 NOTE — Progress Notes (Signed)
 Subjective:    Patient ID: Nicholas Decker is a 38 y.o. male.  HPI    Debbra Fairy, MD, PhD [facility] [address], [address] [address] [place], [place] [address]  Dear Nicholas Decker,   I saw your patient, Nicholas Decker, upon your kind request in my sleep clinic today for initial consultation of his sleep disorder, in particular, concern for underlying obstructive sleep apnea.  The patient is unaccompanied today.  As you know, Nicholas Decker is a 38 year old male with an underlying medical history of allergies, anxiety, depression, and severe obesity with a BMI of over 45, who reports snoring and excessive daytime somnolence as well as witnessed breathing pauses while asleep per wife's observation.  His Epworth sleepiness score is 16 out of 24, fatigue severity score is 63 out of 63.  He had a home sleep test in 2018 I was able to review test results in his electronic chart.  His home sleep test from 11/08/2017 showed a RDI of 2.9/h, O2 nadir percent.  He reports weight fluctuation, he has gained weight over time.  He is currently on mirtazapine to help him sleep at night, he is encouraged by his psychiatrist to see sleep study evaluation.  He is currently on leave for the past 2 months but is scheduled to go back soon.  He works as a Financial risk analyst.  He lives with his wife, no children at the house, he has 1 roommate and 2 cats in the household, the cats typically sleep in their bedroom.  He does like to play computer games/video games.  Currently his bedtime is between 11:30 PM and 11:45 PM and rise time varies.  When he goes to work, he will typically be in bed around 7:30 but still likes to play games on his phone and he has to be up typically between 4:30 AM and 5 AM.  He has nocturia about once or twice per average night and has had occasional morning headaches.  He is not aware of any family history of sleep apnea, does not know family history.  He drinks caffeine  occasionally, not daily.  He drinks alcohol rarely.  He is a non-smoker of cigarettes and does not vape nicotine and has not had any recent THC products but has had edibles before.  His Past Medical History Is Significant For: Past Medical History:  Diagnosis Date   Seasonal allergies     His Past Surgical History Is Significant For: History reviewed. No pertinent surgical history.  His Family History Is Significant For: Family History  Problem Relation Age of Onset   Depression Mother    Thyroid disease Mother    Cancer Father        either throat or lung; mets to brain   Cancer Brother 51       pancreatic   Heart disease Maternal Grandmother    Heart disease Maternal Grandfather    Cancer Paternal Grandfather    Sleep apnea Neg Hx     His Social History Is Significant For: Social History   Socioeconomic History   Marital status: Married    Spouse name: Not on file   Number of children: Not on file   Years of education: Not on file   Highest education level: Not on file  Occupational History   Not on file  Tobacco Use   Smoking status: Never   Smokeless tobacco: Never  Vaping Use   Vaping status: Never Used  Substance and Sexual Activity  Alcohol use: Yes    Comment: Social maybe twice a year   Drug use: No   Sexual activity: Yes    Partners: Female  Other Topics Concern   Not on file  Social History Narrative   Heritage manager   Wife is Marine scientist   No children   Social Drivers of Health   Financial Resource Strain: Not on file  Food Insecurity: Not on file  Transportation Needs: Not on file  Physical Activity: Not on file  Stress: Not on file  Social Connections: Unknown (10/07/2023)   Received from [facility]   Social Network    Social Network: Not on file    His Allergies Are:  No Known Allergies:   His Current Medications Are:  Outpatient Encounter Medications as of 05/19/2024  Medication Sig   albuterol  (VENTOLIN  HFA)  108 (90 Base) MCG/ACT inhaler Inhale 2 puffs into the lungs every 6 (six) hours as needed for wheezing or shortness of breath.   cetirizine (ZYRTEC) 10 MG tablet Take 10 mg by mouth daily.   DENTA 5000 PLUS 1.1 % CREA dental cream Place 1 Application onto teeth at bedtime.   hydrOXYzine  (ATARAX ) 10 MG tablet TAKE 1 TABLET BY MOUTH THREE TIMES A DAY AS NEEDED   ibuprofen  (ADVIL ) 600 MG tablet Take 1 tablet (600 mg total) by mouth every 8 (eight) hours as needed.   pantoprazole  (PROTONIX ) 40 MG tablet TAKE 1 TABLET BY MOUTH DAILY   sertraline  (ZOLOFT ) 100 MG tablet Take 100 mg by mouth 2 (two) times daily.   Vitamin D , Ergocalciferol , (DRISDOL ) 1.25 MG (50000 UNIT) CAPS capsule Take 1 capsule (50,000 Units total) by mouth every 7 (seven) days.   No facility-administered encounter medications on file as of 05/19/2024.  :   Review of Systems:  Out of a complete 14 point review of systems, all are reviewed and negative with the exception of these symptoms as listed below:   Review of Systems  Neurological:        Pt here for sleep consult  Pt snores,headaches,fatigue Pt denies hypertension,cpap machine Pt states had sleep study 2017 never received results of test    ESS:16 FSS:63     Objective:  Neurological Exam  Physical Exam Physical Examination:   Vitals:   05/19/24 1524  BP: (!) 139/90  Pulse: (!) 119    General Examination: The patient is a very pleasant 38 y.o. male in no acute distress. He appears well-developed and well-nourished and well groomed.   HEENT: Normocephalic, atraumatic, pupils are equal, round and reactive to light, extraocular tracking is good without limitation to gaze excursion or nystagmus noted. Hearing is grossly intact. Face is symmetric with normal facial animation. Speech is clear with no dysarthria noted. There is no hypophonia. There is no lip, neck/head, jaw or voice tremor. Neck is supple with full range of passive and active motion. There are no  carotid bruits on auscultation. Oropharynx exam reveals: mild mouth dryness, adequate dental hygiene and moderate airway crowding, due to small airway entry and elongated uvula, tonsillar size 1-2+ on the right and 1+ on the left, Mallampati class III.  Neck circumference 18 5/8 inches.  Minimal.  Tongue protrudes centrally and palate elevates symmetrically.  Chest: Clear to auscultation without wheezing, rhonchi or crackles noted.  Heart: S1+S2+0, regular and normal without murmurs, rubs or gallops noted.   Abdomen: Soft, non-tender and non-distended.  Extremities: There is no pitting edema in the distal lower extremities bilaterally.  Skin: Warm and dry without trophic changes noted.   Musculoskeletal: exam reveals no obvious joint deformities.   Neurologically:  Mental status: The patient is awake, alert and oriented in all 4 spheres. His immediate and remote memory, attention, language skills and fund of knowledge are appropriate. There is no evidence of aphasia, agnosia, apraxia or anomia. Speech is clear with normal prosody and enunciation. Thought process is linear. Mood is normal and affect is normal.  Cranial nerves II - XII are as described above under HEENT exam.  Motor exam: Normal bulk, strength and tone is noted. There is no obvious action or resting tremor.  Fine motor skills and coordination: grossly intact.  Cerebellar testing: No dysmetria or intention tremor. There is no truncal or gait ataxia.  Sensory exam: intact to light touch in the upper and lower extremities.  Gait, station and balance: He stands easily. No veering to one side is noted. No leaning to one side is noted. Posture is age-appropriate and stance is narrow based. Gait shows normal stride length and normal pace. No problems turning are noted.   Assessment and Plan:   In summary, Nicholas Decker is a very pleasant 38 y.o.-year old male with an underlying medical history of allergies, anxiety, depression,  and severe obesity with a BMI of over 45, whose history and physical exam are concerning for sleep disordered breathing, particularly obstructive sleep apnea (OSA). A laboratory attended sleep study is typically considered "gold standard" for evaluation of sleep disordered breathing.   I had a long chat with the patient about my findings and the diagnosis of sleep apnea, particularly OSA, its prognosis and treatment options. We talked about medical/conservative treatments, surgical interventions and non-pharmacological approaches for symptom control. I explained, in particular, the risks and ramifications of untreated moderate to severe OSA, especially with respect to developing cardiovascular disease down the road, including congestive heart failure (CHF), difficult to treat hypertension, cardiac arrhythmias (particularly A-fib), neurovascular complications including TIA, stroke and dementia. Even type 2 diabetes has, in part, been linked to untreated OSA. Symptoms of untreated OSA may include (but may not be limited to) daytime sleepiness, nocturia (i.e. frequent nighttime urination), memory problems, mood irritability and suboptimally controlled or worsening mood disorder such as depression and/or anxiety, lack of energy, lack of motivation, physical discomfort, as well as recurrent headaches, especially morning or nocturnal headaches. We talked about the importance of maintaining a healthy lifestyle and striving for healthy weight. In addition, we talked about the importance of striving for and maintaining good sleep hygiene. I recommended a sleep study at this time. I outlined the differences between a laboratory attended sleep study which is considered more comprehensive and accurate over the option of a home sleep test (HST); the latter may lead to underestimation of sleep disordered breathing in some instances and does not help with diagnosing upper airway resistance syndrome and is not accurate enough to  diagnose primary central sleep apnea typically. I outlined possible surgical and non-surgical treatment options of OSA, including the use of a positive airway pressure (PAP) device (i.e. CPAP, AutoPAP/APAP or BiPAP in certain circumstances), a custom-made dental device (aka oral appliance, which would require a referral to a specialist dentist or orthodontist typically, and is generally speaking not considered for patients with full dentures or edentulous state), upper airway surgical options, such as traditional UPPP (which is not considered a first-line treatment) or the Inspire device (hypoglossal nerve stimulator, which would involve a referral for consultation with an ENT surgeon, after  careful selection, following inclusion criteria - also not first-line treatment). I explained the PAP treatment option to the patient in detail, as this is generally considered first-line treatment.  The patient indicated that he would be willing to try PAP therapy, if the need arises. I explained the importance of being compliant with PAP treatment, not only for insurance purposes but primarily to improve patient's symptoms symptoms, and for the patient's long term health benefit, including to reduce His cardiovascular risks longer-term.    We will pick up our discussion about the next steps and treatment options after testing.  We will keep him posted as to the test results by phone call and/or MyChart messaging where possible.  We will plan to follow-up in sleep clinic accordingly as well.  I answered all his questions today and the patient was in agreement.   I encouraged him to call with any interim questions, concerns, problems or updates or email us  through MyChart.  Generally speaking, sleep test authorizations may take up to 2 weeks, sometimes less, sometimes longer, the patient is encouraged to get in touch with us  if they do not hear back from the sleep lab staff directly within the next 2 weeks.  Thank you  very much for allowing me to participate in the care of this nice patient. If I can be of any further assistance to you please do not hesitate to call me at [phone].  Sincerely,   Debbra Fairy, MD, PhD

## 2024-05-19 NOTE — Patient Instructions (Signed)

## 2024-05-31 ENCOUNTER — Inpatient Hospital Stay: Attending: Genetic Counselor | Admitting: Genetic Counselor

## 2024-05-31 ENCOUNTER — Inpatient Hospital Stay

## 2024-05-31 ENCOUNTER — Encounter: Payer: Self-pay | Admitting: Genetic Counselor

## 2024-05-31 DIAGNOSIS — Z800 Family history of malignant neoplasm of digestive organs: Secondary | ICD-10-CM | POA: Diagnosis not present

## 2024-05-31 LAB — GENETIC SCREENING ORDER

## 2024-05-31 NOTE — Progress Notes (Signed)
 REFERRING PROVIDER: Alexander Iba, [place] [address] [place],  [place] [address]  PRIMARY PROVIDER:  Alexander Iba, [place]  PRIMARY REASON FOR VISIT:  1. Family history of malignant neoplasm of gastrointestinal tract    HISTORY OF PRESENT ILLNESS:   Nicholas Decker, a 38 y.o. male, was seen for a [place] cancer genetics consultation at the request of Dr. Roark Chick due to a family history of cancer.  Nicholas Decker presents to clinic today to discuss the possibility of a hereditary predisposition to cancer, genetic testing, and to further clarify his future cancer risks, as well as potential cancer risks for family members.   Nicholas Decker is a 38 y.o. male with no personal history of cancer.    RELEVANT MEDICAL HISTORY:  Colonoscopy: not completed  Normal PSA 2018  No dermatology concerns  History of severe anxiety and depression, currently on medical leave from work.  Past Medical History:  Diagnosis Date   Seasonal allergies     No past surgical history on file.  Social History   Socioeconomic History   Marital status: Married    Spouse name: Not on file   Number of children: Not on file   Years of education: Not on file   Highest education level: Not on file  Occupational History   Not on file  Tobacco Use   Smoking status: Never   Smokeless tobacco: Never  Vaping Use   Vaping status: Never Used  Substance and Sexual Activity   Alcohol use: Yes    Comment: Social maybe twice a year   Drug use: No   Sexual activity: Yes    Partners: Female  Other Topics Concern   Not on file  Social History Narrative   Heritage manager   Wife is Marine scientist   No children   Social Drivers of Corporate investment banker Strain: Not on file  Food Insecurity: Not on file  Transportation Needs: Not on file  Physical Activity: Not on file  Stress: Not on file  Social Connections: Unknown (10/07/2023)   Received from [employer]   Social Network    Social  Network: Not on file     FAMILY HISTORY:  We obtained a detailed, 4-generation family history.  Significant diagnoses are listed below: Family History  Problem Relation Age of Onset   Depression Mother    Thyroid disease Mother    Cancer Father        either throat or lung; mets to brain   Heart disease Maternal Grandmother    Heart disease Maternal Grandfather    Cancer Maternal Grandfather        unknown   Cancer Half-Brother 76       pancreatic   Congenital heart disease Half-Brother        d. 18 from flu   Breast cancer Other 47       maternal great aunt   Heart disease Half-Sibling    Sleep apnea Neg Hx     Nicholas Decker is unaware of previous family history of genetic testing for hereditary cancer risks. There is no reported Ashkenazi Jewish ancestry.   Nicholas Decker reports a maternal half-brother diagnosed with pancreatic cancer at age 10, passed away at age 65. He reports his maternal grandfather may have been diagnosed with cancer, unsure of specifics, and passed away young. He reports a maternal great aunt (unknown if related to his grandmother or grandfather) diagnosed with breast cancer at 72, passed away at 25.  He reports his father was diagnosed with either throat or lung cancer, metastatic to brain, passed away. He had a history of cigarette use. He does not report any additional family history of cancer, but did share that he is not in close contact with extended relatives.     GENETIC COUNSELING ASSESSMENT: Mr. Dziedzic is a 38 y.o. male with a family history of cancer which is somewhat suggestive of an inherited predisposition to cancer given the history of a second degree relative with pancreatic cancer and a third degree relative with breast cancer on the maternal side of the family. We, therefore, discussed and recommended the following at today's visit.   DISCUSSION: We discussed that, in general, most cancer is not inherited in families, but instead is  sporadic or familial. Sporadic cancers occur by chance and typically happen at older ages (>50 years) as this type of cancer is caused by genetic changes acquired during an individual's lifetime. Some families have more cancers than would be expected by chance; however, the ages or types of cancer are not consistent with a known genetic mutation or known genetic mutations have been ruled out. This type of familial cancer is thought to be due to a combination of multiple genetic, environmental, hormonal, and lifestyle factors. While this combination of factors likely increases the risk of cancer, the exact source of this risk is not currently identifiable or testable.  We discussed that 5 - 10% of cancer is hereditary. We discussed that testing is beneficial for several reasons including knowing how to screen individuals for cancer, to identify preventative measures, and to understand if other family members could be at risk for cancer and allow them to undergo genetic testing.   We reviewed the characteristics, features and inheritance patterns of hereditary cancer syndromes. We also discussed genetic testing, including the appropriate family members to test, the process of testing, insurance coverage and turn-around-time for results. We discussed the implications of a negative, positive, carrier and/or variant of uncertain significant result. Nicholas Decker  was offered a common hereditary cancer panel (36+ genes) and an expanded pan-cancer panel (70+ genes). Nicholas Decker was informed of the benefits and limitations of each panel, including that expanded pan-cancer panels contain genes that do not have clear management guidelines at this point in time.  We also discussed that as the number of genes included on a panel increases, the chances of variants of uncertain significance increases. Nicholas Decker decided to pursue genetic testing for the 74 gene panel. The CancerNext-Expanded gene panel offered by [facility] and includes sequencing, rearrangement, and RNA analysis for the following 77 genes: AIP, ALK, APC, ATM, AXIN2, BAP1m BARD1, BMPR1Am BRCA1, BRCA2, BRIP1, CDC73, CDH1, CDK4, CDKN1B, CDKN2A, CEBPA, CHEK2, CTNNA1, DDX41, DICER1, EGFR, EPCAM, ETV6, FH, FLCN, GATA2, GREM1, HOXB13, KIT, LZTR1, MAX, MBD4, MEN1, MET, MITF, MLH1, MSH2, MSH3, MSH6, MUTYH, NF1, NF2, NTHL1, PALB2, PDGFRA, PHOX2B, PMS2, POLD1, POLE, POT1, PRKAR1A, PTCH1m PTEN, RAD51C, RAD51D, RB1, RET, RPS20, RUNX1, SDHA, SDHAF2, SDHB, SDHC, SDHD, SMAD4, SMARCA4, SMARCB1, SMARCE1, STK11, SUFU, TMEM127, TP53, TSC1, TSC2, VHL, WT1.   Based on Nicholas Decker family history of cancer, he does not meet medical criteria for genetic testing. He understands that testing may not be covered by insurance, but is still interested in proceeding. Though Nicholas Decker is not personally affected, there are no affected family members that are willing/able/available to undergo hereditary cancer testing.  Therefore, Nicholas Decker the most informative family member available. We discussed that if his out of  pocket cost for testing is over $100, the laboratory will call and confirm whether he wants to proceed with testing.  If the out of pocket cost of testing is less than $100 he will be billed by the genetic testing laboratory. If not covered by insurance, Nicholas Decker would have the option to flip to self-pay testing, which is $250.00 out of pocket.   We discussed that some people do not want to undergo genetic testing due to fear of genetic discrimination.  The Genetic Information Nondiscrimination Act (GINA) was signed into federal law in 2008. GINA prohibits health insurers and most employers from discriminating against individuals based on genetic information (including the results of genetic tests and family history information). According to GINA, health insurance companies cannot consider genetic information to be a preexisting condition, nor can they use  it to make decisions regarding coverage or rates. GINA also makes it illegal for most employers to use genetic information in making decisions about hiring, firing, promotion, or terms of employment. It is important to note that GINA does not offer protections for life insurance, disability insurance, or long-term care insurance. GINA does not apply to those in the [employer], those who work for companies with less than 15 employees, and new life insurance or long-term disability insurance policies.  Health status due to a cancer diagnosis is not protected under GINA. More information about GINA can be found by visiting [email or web address].  History of severe anxiety and depression, currently on medical leave from work. Does endorse thoughts of suicide, but reports he does not have a plan or intent to act. He follows with psychiatrist and therapist. When asked, he shared that he does not feel that genetic test results would worsen his anxiety or depression. He wants the information about any genetic risks for cancer to be proactive in his health care and make informed choices in cancer screening and prevention.   PLAN: After considering the risks, benefits, and limitations, Mr. Decker provided informed consent to pursue genetic testing and the blood sample was sent to [facility] for analysis of the CancerNext-Expanded +RNAinsight test. Results should be available within approximately 2-3 weeks' time, at which point they will be disclosed by telephone to Nicholas Decker, as will any additional recommendations warranted by these results. Nicholas Decker will receive a summary of his genetic counseling visit and a copy of his results once available. This information will also be available in Epic.   Lastly, we encouraged Mr. Cozby to remain in contact with cancer genetics annually so that we can continuously update the family history and inform him of any changes in cancer genetics and testing that may be  of benefit for this family.   Mr. Cappello's questions were answered to his satisfaction today. Our contact information was provided should additional questions or concerns arise. Thank you for the referral and allowing us  to share in the care of your patient.   Nicholas Mulders, MS, [facility] Licensed, Retail [email or web address] phone: [phone]  60 minutes were spent on the date of the encounter in service to the patient including preparation, face-to-face consultation, documentation and care coordination.  The patient was seen alone. Drs. Johnna Nakai, and/or Gudena were available for questions, if needed..    _______________________________________________________________________ For Office Staff:  Number of people involved in session: 1 Was an Intern/ student involved with case: yes

## 2024-06-02 ENCOUNTER — Telehealth: Payer: Self-pay | Admitting: Neurology

## 2024-06-02 NOTE — Telephone Encounter (Signed)
 [employer]PSG [employer] pending.

## 2024-06-08 NOTE — Telephone Encounter (Signed)
 [employer]PSG [employer] auth: [id number] (exp. 06/03/24 to 09/05/24)

## 2024-06-08 NOTE — Telephone Encounter (Signed)
 NPSG [employer] auth: [id number] (exp. 06/03/24 to 09/05/24)   Patient is scheduled at [facility] for 07/01/24 at 8 pm.  Mailed packet and sent mychart

## 2024-06-16 ENCOUNTER — Telehealth: Payer: Self-pay | Admitting: Genetic Counselor

## 2024-06-16 ENCOUNTER — Ambulatory Visit: Payer: Self-pay | Admitting: Genetic Counselor

## 2024-06-16 DIAGNOSIS — Z1379 Encounter for other screening for genetic and chromosomal anomalies: Secondary | ICD-10-CM | POA: Insufficient documentation

## 2024-06-16 NOTE — Telephone Encounter (Signed)
 GC Intern, Moira Andrews, spoke to Nicholas Decker to review results of genetic testing. he had genetic testing with [facility] CancerNext-Expanded +RNAinsight panel. 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 We discussed that we do not use VUS to make management decisions or identify at risk family members. Discussed that we do not know why there is cancer in the family. It could be due to a different gene that we are not testing, or maybe our current technology may not be able to pick something up.  It will be important for him to keep in contact with genetics to keep up with whether additional testing may be needed. We will contact him if new information is learned about the VUS.   Please see counseling note for further detail on this result.     Call completed under direct supervision by me.

## 2024-06-16 NOTE — Progress Notes (Signed)
 HPI:  Nicholas Decker. Nicholas Decker was previously seen in the [facility] due to a family history of cancer and concerns regarding a hereditary predisposition to cancer. Please refer to our prior cancer genetics clinic note for more information regarding our discussion, assessment and recommendations, at the time. Nicholas Decker's recent genetic test results were disclosed to him, as were recommendations warranted by these results. These results and recommendations are discussed in more detail below.  FAMILY HISTORY:  We obtained a detailed, 4-generation family history.  Significant diagnoses are listed below: Family History  Problem Relation Age of Onset   Depression Mother    Thyroid disease Mother    Cancer Father        either throat or lung; mets to brain   Heart disease Maternal Grandmother    Heart disease Maternal Grandfather    Cancer Maternal Grandfather        unknown   Cancer Half-Brother 77       pancreatic   Congenital heart disease Half-Brother        d. 66 from flu   Breast cancer Other 59       maternal great aunt   Heart disease Half-Sibling    Sleep apnea Neg Hx     Nicholas Decker is unaware of previous family history of genetic testing for hereditary cancer risks. There is no reported Ashkenazi Jewish ancestry.    Nicholas Decker reports a maternal half-brother diagnosed with pancreatic cancer at age 35, passed away at age 66. He reports his maternal grandfather may have been diagnosed with cancer, unsure of specifics, and passed away young. He reports a maternal great aunt (unknown if related to his grandmother or grandfather) diagnosed with breast cancer at 59, passed away at 3. He reports his father was diagnosed with either throat or lung cancer, metastatic to brain, passed away. He had a history of cigarette use. He does not report any additional family history of cancer, but did share that he is not in close contact with extended relatives.      GENETIC  TEST RESULTS: Genetic testing reported out on 06/14/24 through the CancerNext-Expanded +RNAinsight panel found no pathogenic mutations. The CancerNext-Expanded gene panel offered by [facility] and includes sequencing, rearrangement, and RNA analysis for the following 77 genes: AIP, ALK, APC, ATM, AXIN2, BAP1, BARD1, BMPR1A, BRCA1, BRCA2, BRIP1, CDC73, CDH1, CDK4, CDKN1B, CDKN2A, CEBPA, CHEK2, CTNNA1, DDX41, DICER1, EGFR, EPCAM, ETV6, FH, FLCN, GATA2, GREM1, HOXB13, KIT, LZTR1, MAX, MBD4, MEN1, MET, MITF, MLH1, MSH2, MSH3, MSH6, MUTYH, NF1, NF2, NTHL1, PALB2, PDGFRA, PHOX2B, PMS2, POLD1, POLE, POT1, PRKAR1A, PTCH1, PTEN, RAD51C, RAD51D, RB1, RET, RPS20, RUNX1, SDHA, SDHAF2, SDHB, SDHC, SDHD, SMAD4, SMARCA4, SMARCB1, SMARCE1, STK11, SUFU, TMEM127, TP53, TSC1, TSC2, VHL, WT1. The test report has been scanned into EPIC and is located under the Molecular Pathology section of the Results Review tab.  A portion of the result report is included below for reference.     We discussed with Nicholas Decker that because current genetic testing is not perfect, it is possible there may be a gene mutation in one of these genes that current testing cannot detect, but that chance is small.  We also discussed, that there could be another gene that has not yet been discovered, or that we have not yet tested, that is responsible for the cancer diagnoses in the family. It is also possible there is a hereditary cause for the cancer in the family that Nicholas Decker did not inherit and  therefore was not identified in his testing.  Therefore, it is important to remain in touch with cancer genetics in the future so that we can continue to offer Nicholas Decker the most up to date genetic testing.   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  At this time, it is unknown if this variant is associated with increased cancer risk or if this is a normal finding, but  most variants such as this get reclassified to being inconsequential. It should not be used to make medical management decisions. With time, we suspect the lab will determine the significance of this variant, if any. If we do learn more about it, we will try to contact Nicholas Decker to discuss it further. However, it is important to stay in touch with us  periodically and keep the address and phone number up to date.  ADDITIONAL GENETIC TESTING: We discussed with Nicholas Decker that his genetic testing was fairly extensive.  If there are genes identified to increase cancer risk that can be analyzed in the future, we would be happy to discuss and coordinate this testing at that time.    CANCER SCREENING RECOMMENDATIONS: Nicholas Decker's test result is considered negative (normal).  This means that we have not identified a hereditary cause for his family history of cancer at this time. Most cancers happen by chance and this negative test suggests that his family history of cancer may fall into this category.    Possible reasons for Nicholas Decker's negative genetic test include:  1. There may be a gene mutation in one of these genes that current testing methods cannot detect but that chance is small.  2. There could be another gene that has not yet been discovered, or that we have not yet tested, that is responsible for the cancer diagnoses in the family.  3.  There may be no hereditary risk for cancer in the family. The cancers in Nicholas Decker and/or his family may be sporadic/familial or due to other genetic and environmental factors. 4. It is also possible there is a hereditary cause for the cancer in the family that Nicholas Decker did not inherit.  Therefore, it is recommended he continue to follow the cancer management and screening guidelines provided by his primary healthcare provider. An individual's cancer risk and medical management are not determined by genetic test results alone. Overall cancer  risk assessment incorporates additional factors, including personal medical history, family history, and any available genetic information that may result in a personalized plan for cancer prevention and surveillance  Given Nicholas Decker's family histories, we must interpret these negative results with some caution.  Families with features suggestive of hereditary risk for cancer tend to have multiple family members with cancer, diagnoses in multiple generations and diagnoses before the age of 77. Nicholas Decker's family exhibits some of these features. Thus, this result may simply reflect our current inability to detect all mutations within these genes or there may be a different gene that has not yet been discovered or tested.   An individual's cancer risk and medical management are not determined by genetic test results alone. Overall cancer risk assessment incorporates additional factors, including personal medical history, family history, and any available genetic information that may result in a personalized plan for cancer prevention and surveillance.  RECOMMENDATIONS FOR FAMILY MEMBERS:  Individuals in this family might be at some increased risk of developing cancer, over the general population risk,  simply due to the family history of cancer.  We recommended women in this family have a yearly mammogram beginning at age 20, or 76 years younger than the earliest onset of cancer, an annual clinical breast exam, and perform monthly breast self-exams. Women in this family should also have a gynecological exam as recommended by their primary provider. All family members should be referred for colonoscopy starting at age 1, or 60 years younger than the earliest onset of cancer.  It is also possible there is a hereditary cause for the cancer in Nicholas Decker's family that he did not inherit and therefore was not identified in him. Genetic testing may be beneficial for other family members as well.    FOLLOW-UP: Lastly, we discussed with Nicholas Decker. Delauder that cancer genetics is a rapidly advancing field and it is possible that new genetic tests will be appropriate for him and/or his family members in the future. We encouraged him to remain in contact with cancer genetics on an annual basis so we can update his personal and family histories and let him know of advances in cancer genetics that may benefit this family.   Our contact number was provided. Nicholas Decker. Faulk's questions were answered to his satisfaction, and he knows he is welcome to call us  at anytime with additional questions or concerns.   Jobie Mulders, MS, [facility] Licensed, Retail [email or web address] [phone]

## 2024-07-01 ENCOUNTER — Ambulatory Visit (INDEPENDENT_AMBULATORY_CARE_PROVIDER_SITE_OTHER): Admitting: Neurology

## 2024-07-01 DIAGNOSIS — G4731 Primary central sleep apnea: Secondary | ICD-10-CM

## 2024-07-01 DIAGNOSIS — R519 Headache, unspecified: Secondary | ICD-10-CM

## 2024-07-01 DIAGNOSIS — R0681 Apnea, not elsewhere classified: Secondary | ICD-10-CM

## 2024-07-01 DIAGNOSIS — G4720 Circadian rhythm sleep disorder, unspecified type: Secondary | ICD-10-CM

## 2024-07-01 DIAGNOSIS — Z9189 Other specified personal risk factors, not elsewhere classified: Secondary | ICD-10-CM

## 2024-07-01 DIAGNOSIS — R351 Nocturia: Secondary | ICD-10-CM

## 2024-07-01 DIAGNOSIS — G4761 Periodic limb movement disorder: Secondary | ICD-10-CM

## 2024-07-01 DIAGNOSIS — G4719 Other hypersomnia: Secondary | ICD-10-CM

## 2024-07-01 DIAGNOSIS — R635 Abnormal weight gain: Secondary | ICD-10-CM

## 2024-07-01 DIAGNOSIS — R0683 Snoring: Secondary | ICD-10-CM

## 2024-07-01 DIAGNOSIS — G4733 Obstructive sleep apnea (adult) (pediatric): Secondary | ICD-10-CM

## 2024-07-03 ENCOUNTER — Other Ambulatory Visit: Payer: Self-pay | Admitting: Physician Assistant

## 2024-07-05 ENCOUNTER — Ambulatory Visit: Payer: Self-pay | Admitting: Neurology

## 2024-07-05 DIAGNOSIS — G4733 Obstructive sleep apnea (adult) (pediatric): Secondary | ICD-10-CM

## 2024-07-05 NOTE — Procedures (Signed)
 Physician Interpretation:     [facility] POLYSOMNOGRAPHY  INTERPRETATION REPORT   STUDY DATE:  07/01/2024     PATIENT NAME:  Nicholas Decker         DATE OF BIRTH:  Dec 16, 1986  PATIENT ID:  [id number]    TYPE OF STUDY:  PSG  READING PHYSICIAN: TRUE MAR, MD, PhD   SCORING TECHNICIAN: Delon Sprung, RPSGT     Referred by: Job Lukes, [place]  ? History and Indication for Testing: 38 year old male with an underlying medical history of allergies, anxiety, depression, and severe obesity with a BMI of over 45, who reports snoring and excessive daytime somnolence as well as witnessed breathing pauses while asleep per wife's observation. His Epworth sleepiness score is 16 out of 24, fatigue severity score is 63 out of 63. Height: 71 in Weight: 326 lb (BMI 45) Neck Size: 18 in    MEDICATIONS: Albuterol , Zyrtec, Atarax , Protonix , Zoloft , Vitamin D    TECHNICAL DESCRIPTION: A registered sleep technologist was in attendance for the duration of the recording.  Data collection, scoring, video monitoring, and reporting were performed in compliance with the AASM Manual for the Scoring of Sleep and Associated Events; (Hypopnea is scored based on the criteria listed in Section VIII D. 1b in the AASM Manual V2.6 using a 4% oxygen desaturation rule or Hypopnea is scored based on the criteria listed in Section VIII D. 1a in the AASM Manual V2.6 using 3% oxygen desaturation and /or arousal rule).   SLEEP CONTINUITY AND SLEEP ARCHITECTURE:  Lights-out was at 21:47: and lights-on at  06:02:, with a total recording time of 8 hours, 13.5 min. Total sleep time ( TST) was 422.0 minutes with a normal sleep efficiency at 85.5%. There was  4.9% REM sleep.   BODY POSITION:  TST was divided  between the following sleep positions: 90.5% supine;  9.5% lateral;  0% prone. Duration of total sleep and percent of total sleep in their respective position is as follows: supine 382 minutes  (91%), non-supine 40 minutes (9%); right 00 minutes (0%), left 40 minutes (9%), and prone 00 minutes (0%).  Total supine REM sleep time was 20 minutes (100% of total REM sleep).  Sleep latency was normal at 10.5 minutes.  REM sleep latency was markedly increased at 373.0 minutes. Of the total sleep time, the percentage of stage N1 sleep was 0.2%, stage N2 sleep was 89%, which is markedly increased, stage N3 sleep was 6.4%, and REM sleep was 4.9%, which is markedly reduced.  Wake after sleep onset (WASO) time accounted for 61 minutes with minimal to mild sleep fragmentation noted.   RESPIRATORY MONITORING:   Based on CMS criteria (using a 4% oxygen desaturation rule for scoring hypopneas), there were 51 apneas (0 obstructive; 51 central; 0 mixed), and 94 hypopneas.  Apnea index was 7.3. Hypopnea index was 13.4. The apnea-hypopnea index was 20.6 overall (20.4 supine, 0 non-supine; 41.0 REM, 41.0 supine REM).  There were 0 respiratory effort-related arousals (RERAs).  The RERA index was 0 events/h. Total respiratory disturbance index (RDI) was 20.6 events/h. RDI results showed: supine RDI  20.4 /h; non-supine RDI 22.5 /h; REM RDI 41.0 /h, supine REM RDI 41.0 /h.   Based on AASM criteria (using a 3% oxygen desaturation and /or arousal rule for scoring hypopneas), there were 51 apneas (0 obstructive; 51 central; 0 mixed), and 94 hypopneas. Apnea index was 7.3. Hypopnea index was 13.4. The apnea-hypopnea index was 20.6/hour overall (20.4 supine, 0 non-supine;  41.0 REM, 41.0 supine REM).  There were 0 respiratory effort-related arousals (RERAs).  The RERA index was 0 events/h. Total respiratory disturbance index (RDI) was 20.6 events/h. RDI results showed: supine RDI  20.4 /h; non-supine RDI 22.5 /h; REM RDI 41.0 /h, supine REM RDI 41.0 /h.    OXIMETRY: Oxyhemoglobin Saturation Nadir during sleep was at  80% from a mean of 94%.  Of the Total sleep time (TST)   hypoxemia (=<88%) was present for  3.2 minutes, or  0.8% of total sleep time.    LIMB MOVEMENTS: There were 127 periodic limb movements of sleep (18.1/hr), of which 8 (1.1/hr) were associated with an arousal.   AROUSAL: There were 55 arousals in total, for an arousal index of 8 arousals/hour.  Of these, 25 were identified as respiratory-related arousals (4 /h), 8 were PLM-related arousals (1 /h), and 40 were non-specific arousals (6 /h).    EEG: Review of the EEG showed no abnormal electrical discharges and symmetrical bihemispheric findings.      EKG: The EKG revealed normal sinus rhythm (NSR). The average heart rate during sleep was 71 bpm.    AUDIO/VIDEO REVIEW: The audio and video review did not show any abnormal or unusual behaviors, movements, phonations or vocalizations. The patient took no restroom breaks. Snoring was noted, ranging from mild to louder.    POST-STUDY QUESTIONNAIRE: Post study, the patient indicated, that sleep was the same as usual.     IMPRESSION:    1. Obstructive Sleep Apnea (OSA) 2. Central Sleep Apnea 3. 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 Dysfunctions associated with sleep stages or arousal from sleep  RECOMMENDATIONS:    1. This study demonstrates moderate sleep apnea with a primary obstructive component and a lesser central component.  The total AHI was 20.6/h, with a central AHI of 7.25/h.  O2 nadir was 80%.  The reduced percentage of REM sleep during this study may have led to an underestimation of his sleep disordered breathing.  Treatment with a positive airway pressure (PAP) device is recommended.  The patient will be advised to proceed with home autoPAP therapy for now.  A laboratory attended, full night PAP-titration study can be considered down the road, to optimize therapy settings, mask fit, monitoring of tolerance and of proper oxygen saturations. Other treatment options may be limited, and may include (generally speaking) surgical options in selected patients. Concomitant weight  loss is highly recommended. Please note that untreated obstructive sleep apnea may carry additional perioperative morbidity. Patients with significant obstructive sleep apnea should receive perioperative PAP therapy and the surgeons and particularly the anesthesiologist should be informed of the diagnosis and the severity of the sleep disordered breathing. 2. Mild PLMs (periodic limb movements of sleep) were noted during this study with no significant arousals; clinical correlation is recommended. Medication effect from the antidepressant medication should be considered.  3. This study shows some sleep fragmentation and abnormal sleep stage percentages; these are nonspecific findings and per se do not signify an intrinsic sleep disorder or a cause for the patient's sleep-related symptoms. Causes include (but are not limited to) the first night effect of the sleep study, circadian rhythm disturbances, medication effect or an underlying mood disorder or medical problem.  4. The patient should be cautioned not to drive, work at heights, or operate dangerous or heavy equipment when tired or sleepy. Review and reiteration of good sleep hygiene measures should be pursued with any patient. 5. The patient will be seen in follow-up in  the sleep clinic at [facility] for discussion of the test results, symptom and treatment compliance review, further management strategies, etc. The patient and the referring provider will be notified of the test results.   I certify that I have reviewed the entire raw data recording prior to the issuance of this report in accordance with the Standards of Accreditation of the American Academy of Sleep Medicine (AASM).  True Mar, MD, PhD Medical Director, [facility]) Diplomat, ABPN (Neurology and Sleep)               Technical Report:   General Information  Name: Nicholas Decker, Nicholas Decker  BMI: 45.47 Physician: TRUE MAR, MD  ID:  [id number] Height: 71.0 in Technician: Delon Sprung, RPSGT  Sex: Male Weight: 326.0 lb Record: [id number]  Age: 38 [October 22, 1986] Date: 07/01/2024    Medical & Medication History    Seasonal allergies Albuterol , Zyrtec, Atarax , Protonix , Zoloft , Vitamin D    Sleep Disorder   OSA   Comments   Patient is a 38 y/o male who was referred to the sleep lab for an NPSG. Patient slept supine. patient had noted hypopneas and CSA. Noted PLMS and loud snoring. Patient had no complaints and tech answered all questions.    Lights out: 09:47:45 PM Lights on: 06:02:34 AM   Time Total Supine Side Prone Upright  Recording (TRT) 8h 13.67m 7h 21.40m 0h 46.70m 0h 0.35m 0h 6.64m  Sleep (TST) 7h 2.73m 6h 22.54m 0h 40.43m 0h 0.90m 0h 0.34m   Latency N1 N2 N3 REM Onset Per. Slp. Eff.  Actual 1h 23.42m 0h 0.31m 0h 16.50m 6h 13.65m 0h 10.56m 0h 13.21m 85.51%   Stg Dur Wake N1 N2 N3 REM  Total 71.5 1.0 373.5 27.0 20.5  Supine 59.0 1.0 333.5 27.0 20.5  Side 6.5 0.0 40.0 0.0 0.0  Prone 0.0 0.0 0.0 0.0 0.0  Upright 6.0 0.0 0.0 0.0 0.0   Stg % Wake N1 N2 N3 REM  Total 14.5 0.2 88.5 6.4 4.9  Supine 12.0 0.2 79.0 6.4 4.9  Side 1.3 0.0 9.5 0.0 0.0  Prone 0.0 0.0 0.0 0.0 0.0  Upright 1.2 0.0 0.0 0.0 0.0     Apnea Summary Sub Supine Side Prone Upright  Total 51 Total 51 50 1 0 0    REM 3 3 0 0 0    NREM 48 47 1 0 0  Obs 0 REM 0 0 0 0 0    NREM 0 0 0 0 0  Mix 0 REM 0 0 0 0 0    NREM 0 0 0 0 0  Cen 51 REM 3 3 0 0 0    NREM 48 47 1 0 0   Rera Summary Sub Supine Side Prone Upright  Total 0 Total 0 0 0 0 0    REM 0 0 0 0 0    NREM 0 0 0 0 0   Hypopnea Summary Sub Supine Side Prone Upright  Total 94 Total 94 80 14 0 0    REM 11 11 0 0 0    NREM 83 69 14 0 0   4% Hypopnea Summary Sub Supine Side Prone Upright  Total (4%) 94 Total 94 80 14 0 0    REM 11 11 0 0 0    NREM 83 69 14 0 0     AHI Total Obs Mix Cen  20.62 Apnea 7.25 0.00 0.00 7.25   Hypopnea 13.36 -- -- --  20.62 Hypopnea (  4%) 13.36 -- -- --    Total  Supine Side Prone Upright  Position AHI 20.62 20.42 22.50 0.00 0.00  REM AHI 40.98   NREM AHI 19.58   Position RDI 20.62 20.42 22.50 0.00 0.00  REM RDI 40.98   NREM RDI 19.58    4% Hypopnea Total Supine Side Prone Upright  Position AHI (4%) 20.62 20.42 22.50 0.00 0.00  REM AHI (4%) 40.98   NREM AHI (4%) 19.58   Position RDI (4%) 20.62 20.42 22.50 0.00 0.00  REM RDI (4%) 40.98   NREM RDI (4%) 19.58    Desaturation Information Threshold: 2% <100% <90% <80% <70% <60% <50% <40%  Supine 407.0 42.0 0.0 0.0 0.0 0.0 0.0  Side 48.0 6.0 0.0 0.0 0.0 0.0 0.0  Prone 0.0 0.0 0.0 0.0 0.0 0.0 0.0  Upright 6.0 0.0 0.0 0.0 0.0 0.0 0.0  Total 461.0 48.0 0.0 0.0 0.0 0.0 0.0  Index 57.3 6.0 0.0 0.0 0.0 0.0 0.0   Threshold: 3% <100% <90% <80% <70% <60% <50% <40%  Supine 268.0 41.0 0.0 0.0 0.0 0.0 0.0  Side 27.0 6.0 0.0 0.0 0.0 0.0 0.0  Prone 0.0 0.0 0.0 0.0 0.0 0.0 0.0  Upright 3.0 0.0 0.0 0.0 0.0 0.0 0.0  Total 298.0 47.0 0.0 0.0 0.0 0.0 0.0  Index 37.0 5.8 0.0 0.0 0.0 0.0 0.0   Threshold: 4% <100% <90% <80% <70% <60% <50% <40%  Supine 197.0 40.0 0.0 0.0 0.0 0.0 0.0  Side 20.0 6.0 0.0 0.0 0.0 0.0 0.0  Prone 0.0 0.0 0.0 0.0 0.0 0.0 0.0  Upright 1.0 0.0 0.0 0.0 0.0 0.0 0.0  Total 218.0 46.0 0.0 0.0 0.0 0.0 0.0  Index 27.1 5.7 0.0 0.0 0.0 0.0 0.0   Threshold: 4% <100% <90% <80% <70% <60% <50% <40%  Supine 197 40 0 0 0 0 0  Side 20 6 0 0 0 0 0  Prone 0 0 0 0 0 0 0  Upright 1 0 0 0 0 0 0  Total 218 46 0 0 0 0 0   Awakening/Arousal Information # of Awakenings 18  Wake after sleep onset 61.53m  Wake after persistent sleep 60.30m   Arousal Assoc. Arousals Index  Apneas 8 1.1  Hypopneas 17 2.4  Leg Movements 8 1.1  Snore 0 0.0  PTT Arousals 0 0.0  Spontaneous 40 5.7  Total 69 9.8  Leg Movement Information PLMS LMs Index  Total LMs during PLMS 127 18.1  LMs w/ Microarousals 8 1.1   LM LMs Index  w/ Microarousal 0 0.0  w/ Awakening 3 0.4  w/ Resp Event 1 0.1  Spontaneous 24 3.4   Total 25 3.6     Desaturation threshold setting: 4% Minimum desaturation setting: 10 seconds SaO2 nadir: 80% The longest event was a 58 sec obstructive Hypopnea with a minimum SaO2 of 91%. The lowest SaO2 was 80% associated with a 15 sec central Apnea. EKG Rates EKG Avg Max Min  Awake 72 94 58  Asleep 71 97 59  EKG Events: Tachycardia

## 2024-07-06 ENCOUNTER — Encounter: Payer: Self-pay | Admitting: Physician Assistant

## 2024-07-06 DIAGNOSIS — F419 Anxiety disorder, unspecified: Secondary | ICD-10-CM

## 2024-07-06 DIAGNOSIS — F341 Dysthymic disorder: Secondary | ICD-10-CM

## 2024-07-07 NOTE — Telephone Encounter (Signed)
 Okay for referral to Psychiatrist?

## 2024-07-13 ENCOUNTER — Ambulatory Visit (INDEPENDENT_AMBULATORY_CARE_PROVIDER_SITE_OTHER): Payer: Self-pay | Admitting: Psychiatry

## 2024-07-13 ENCOUNTER — Encounter (HOSPITAL_COMMUNITY): Payer: Self-pay | Admitting: Psychiatry

## 2024-07-13 VITALS — BP 115/85 | HR 85 | Ht 71.0 in | Wt 328.0 lb

## 2024-07-13 DIAGNOSIS — F332 Major depressive disorder, recurrent severe without psychotic features: Secondary | ICD-10-CM | POA: Diagnosis not present

## 2024-07-13 DIAGNOSIS — F411 Generalized anxiety disorder: Secondary | ICD-10-CM

## 2024-07-13 DIAGNOSIS — R5383 Other fatigue: Secondary | ICD-10-CM

## 2024-07-13 MED ORDER — SERTRALINE HCL 100 MG PO TABS: 200.0000 mg | ORAL_TABLET | Freq: Two times a day (BID) | ORAL | Status: DC

## 2024-07-13 MED ORDER — BUPROPION HCL ER (SR) 100 MG PO TB12: 100.0000 mg | ORAL_TABLET | Freq: Every day | ORAL | 0 refills | Status: DC
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
## 2024-07-20 NOTE — Telephone Encounter (Signed)
 Salick, Jeanne Milliner, Jodeen Munch, RN; Zott, [place]; Bolton, Tammy Got it thanks!

## 2024-07-20 NOTE — Telephone Encounter (Signed)
 Spoke with patient and discussed his sleep study results as noted below by Dr Buck. Patient verbalized understanding and agrees to setup on autopap. His questions were answered. He did not have a preference for DME. He lives in [place]. Will refer to [facility]. Discussed insurance compliance requirements which includes using the machine at least 4 hours at night and also being seen by our office between 30 and 90 days after setup. Patient verbalized appreciation for the call.  Report sent to referring provider. Order sent to [facility] for [facility] setup.

## 2024-07-21 ENCOUNTER — Ambulatory Visit (HOSPITAL_COMMUNITY): Admitting: Registered Nurse

## 2024-07-21 ENCOUNTER — Encounter (HOSPITAL_COMMUNITY): Payer: Self-pay | Admitting: Registered Nurse

## 2024-07-21 DIAGNOSIS — G4700 Insomnia, unspecified: Secondary | ICD-10-CM

## 2024-07-21 DIAGNOSIS — F332 Major depressive disorder, recurrent severe without psychotic features: Secondary | ICD-10-CM | POA: Diagnosis not present

## 2024-07-21 DIAGNOSIS — F411 Generalized anxiety disorder: Secondary | ICD-10-CM

## 2024-07-21 MED ORDER — ARIPIPRAZOLE 2 MG PO TABS: 2.0000 mg | ORAL_TABLET | Freq: Every day | ORAL | 0 refills | Status: DC

## 2024-07-21 MED ORDER — HYDROXYZINE HCL 25 MG PO TABS: 25.0000 mg | ORAL_TABLET | Freq: Three times a day (TID) | ORAL | 1 refills | Status: DC | PRN

## 2024-07-21 MED ORDER — BUSPIRONE HCL 5 MG PO TABS: 5.0000 mg | ORAL_TABLET | Freq: Three times a day (TID) | ORAL | 0 refills | Status: DC

## 2024-07-21 MED ORDER — SERTRALINE HCL 100 MG PO TABS: 200.0000 mg | ORAL_TABLET | Freq: Two times a day (BID) | ORAL | Status: DC

## 2024-07-21 NOTE — Patient Instructions (Signed)
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
## 2024-08-04 ENCOUNTER — Encounter (HOSPITAL_COMMUNITY): Payer: Self-pay | Admitting: Registered Nurse

## 2024-08-04 ENCOUNTER — Telehealth (HOSPITAL_COMMUNITY): Admitting: Registered Nurse

## 2024-08-04 DIAGNOSIS — F332 Major depressive disorder, recurrent severe without psychotic features: Secondary | ICD-10-CM | POA: Diagnosis not present

## 2024-08-04 DIAGNOSIS — G4700 Insomnia, unspecified: Secondary | ICD-10-CM

## 2024-08-04 DIAGNOSIS — F411 Generalized anxiety disorder: Secondary | ICD-10-CM | POA: Diagnosis not present

## 2024-08-04 MED ORDER — HYDROXYZINE HCL 25 MG PO TABS: 25.0000 mg | ORAL_TABLET | Freq: Three times a day (TID) | ORAL | 1 refills | Status: DC | PRN

## 2024-08-04 MED ORDER — ARIPIPRAZOLE 2 MG PO TABS: 2.0000 mg | ORAL_TABLET | Freq: Every day | ORAL | 1 refills | Status: DC

## 2024-08-04 MED ORDER — BUSPIRONE HCL 7.5 MG PO TABS: 7.5000 mg | ORAL_TABLET | Freq: Three times a day (TID) | ORAL | 1 refills | Status: DC

## 2024-08-04 MED ORDER — SERTRALINE HCL 100 MG PO TABS: 200.0000 mg | ORAL_TABLET | Freq: Two times a day (BID) | ORAL | 1 refills | Status: DC

## 2024-08-04 NOTE — Patient Instructions (Addendum)
 If no one has contacted, you by the end of business day today please call the appropriate office listed below to schedule your next visit for medication management with Luisa Ruder, NP:    [facility] at [facility] [address], [place], [place] [address]  5.4 mi Phone: [phone] (Call to schedule appointment)  [facility] at [facility] [address], [place], [place] [address]  25 mi Phone: [phone] (Call to schedule appointment)  ADHD Online Testing         Address:  [phone]?[place], [place] [address] Phone: [phone] Fax: [phone] Email:  [email or web address]  ADHD Care in [place], [place] At [facility], we are dedicated to providing comprehensive ADHD treatment and management for patients of all ages. Our range of services is designed to address the unique needs of each individual, ensuring personalized and effective care.  ADHD Evaluation and Diagnosis Accurate diagnosis is the first step towards effective treatment. Our thorough evaluation process includes:   QbTest to objectively measure ADHD symptoms  Standardized ADHD rating scales  Behavioral assessments  Medical history review  Individualized Treatment Plans We believe in personalized care. Based on your evaluation, we develop a tailored treatment plan that may include:   Medication management  Lifestyle and dietary recommendations  Parent and family education  Social skills training  Organizational and time management strategies  Call 911, [phone], mobile crisis, or present to the nearest emergency room should you experience any suicidal/homicidal ideation, auditory/visual/hallucinations, or detrimental worsening of your mental health.  Mobile Crisis Response Teams Listed by counties in vicinity of [facility] providers [facility]  [phone] [facility] [phone] [facility] [phone] [facility] [phone] [place]                * [facility] [phone]                * [employer] [phone]  [facility] [phone] [facility] [employer]  [phone] * [employer] [phone]

## 2024-08-04 NOTE — Progress Notes (Signed)
 [facility] MD/PA/NP OP Progress Note  08/04/2024 5:14 PM Nicholas Decker  MRN:  [id number]  Virtual Visit via Video Note  I connected with Nicholas Decker on 08/04/24 at  2:00 PM EDT by a video enabled telemedicine application and verified that I am speaking with the correct person using two identifiers.  Location: Patient: Home Provider: Home office   I discussed the limitations of evaluation and management by telemedicine and the availability of in person appointments. The patient expressed understanding and agreed to proceed.  I discussed the assessment and treatment plan with the patient. The patient was provided an opportunity to ask questions and all were answered. The patient agreed with the plan and demonstrated an understanding of the instructions.   The patient was advised to call Decker or seek an in-person evaluation if the symptoms worsen or if the condition fails to improve as anticipated.  I provided 30 minutes of non-face-to-face time during this encounter.   Luisa Ruder, NP   Chief Complaint:  Chief Complaint  Patient presents with   Follow-up    Medication management   HPI: Nicholas Decker 38 y.o. male presents to office today for medication management follow up.  He is seen via virtual video visit by this provider, and chart reviewed on 08/04/24.  His psychiatric history is significant for general anxiety and major depression.  His mental health is currently managed with Vistaril  25 mg Tid prn, Zoloft  200 mg daily, Abilify  2 mg daily, and Buspar  5 mg Tid.  He reports current medication regimen is effectively managing mental health but could be more improvement on anxiety.  States he has noticed some improvement but still anxious.  He states I have had a couple of days that Ive felt peppy but small things can still get me upset or anxious.  He reports he is sleeping and eating without difficulty.  He denies suicidal/self-harm/homicidal ideation, psychosis, paranoia, and  abnormal movements.        Treatment options discussed:  Increase in Buspar  to address anxiety.  He also wanted to discuss medication for ADHD but hasn't been tested yet.  Informed would put resources for testing on AVS.  Keep scheduled appointment with Nicholas Decker for counseling/therapy.  Recommended the following: Continue Zoloft  200 mg daily, Abilify  2 mg daily, and Vistaril  25 mg Tid prn.  Buspar  increased to 7.5 mg Tid.  He voices understanding/agreement with information/recommendations being given to him today.    Visit Diagnosis:    ICD-10-CM   1. Severe episode of recurrent major depressive disorder, without psychotic features (HCC)  F33.2 sertraline  (ZOLOFT ) 100 MG tablet    ARIPiprazole  (ABILIFY ) 2 MG tablet    2. GAD (generalized anxiety disorder)  F41.1 busPIRone  (BUSPAR ) 7.5 MG tablet    hydrOXYzine  (ATARAX ) 25 MG tablet    sertraline  (ZOLOFT ) 100 MG tablet    3. Insomnia, unspecified type  G47.00 hydrOXYzine  (ATARAX ) 25 MG tablet     Past Psychiatric History:  Diagnosis: Major depression, general anxiety Suicide attempt: He reports several suicide attempts that occurred several months ago during a period at which he would get in his car and drive with his eyes closed. Non-suicidal self-injurious behavior: Denies Psychiatric hospitalization: Denies Substance abuse: Reports occasional use of CBD edibles Past psychotropic medication trials: Remeron, Wellbutrin   Past Medical History:  Past Medical History:  Diagnosis Date   Seasonal allergies    History reviewed. No pertinent surgical history.  Family Psychiatric History: See below and family history  Family History:  Family  History  Problem Relation Age of Onset   Depression Mother    Thyroid disease Mother    Cancer Father        either throat or lung; mets to brain   Heart disease Maternal Grandmother    Heart disease Maternal Grandfather    Cancer Maternal Grandfather        unknown   Cancer Half-Brother 57        pancreatic   Congenital heart disease Half-Brother        d. 46 from flu   Breast cancer Other 49       maternal great aunt   Heart disease Half-Sibling    Sleep apnea Neg Hx     Social History:  Social History   Socioeconomic History   Marital status: Married    Spouse name: Not on file   Number of children: Not on file   Years of education: Not on file   Highest education level: Not on file  Occupational History   Not on file  Tobacco Use   Smoking status: Never   Smokeless tobacco: Never  Vaping Use   Vaping status: Never Used  Substance and Sexual Activity   Alcohol use: Yes    Comment: Social maybe twice a year   Drug use: No   Sexual activity: Yes    Partners: Female  Other Topics Concern   Not on file  Social History Narrative   Heritage manager   Wife is Marine scientist   No children   Social Drivers of Health   Financial Resource Strain: Not on file  Food Insecurity: Not on file  Transportation Needs: Not on file  Physical Activity: Not on file  Stress: Not on file  Social Connections: Unknown (10/07/2023)   [facility]   Social Network    Social Network: Not on file    Allergies: No Known Allergies  Metabolic Disorder Labs: Lab Results  Component Value Date   HGBA1C 5.4 04/09/2024   No results found for: PROLACTIN Lab Results  Component Value Date   CHOL 200 04/09/2024   TRIG 127.0 04/09/2024   HDL 38.30 (L) 04/09/2024   CHOLHDL 5 04/09/2024   VLDL 25.4 04/09/2024   LDLCALC 136 (H) 04/09/2024   LDLCALC 116 (H) 03/06/2023   Lab Results  Component Value Date   TSH 2.35 04/09/2024   TSH 1.75 06/17/2017    Current Medications: Current Outpatient Medications  Medication Sig Dispense Refill   albuterol  (VENTOLIN  HFA) 108 (90 Base) MCG/ACT inhaler Inhale 2 puffs into the lungs every 6 (six) hours as needed for wheezing or shortness of breath. 8 g 0   ARIPiprazole  (ABILIFY ) 2 MG tablet Take 1 tablet (2  mg total) by mouth daily. 30 tablet 1   busPIRone  (BUSPAR ) 7.5 MG tablet Take 1 tablet (7.5 mg total) by mouth 3 (three) times daily. 90 tablet 1   cetirizine (ZYRTEC) 10 MG tablet Take 10 mg by mouth daily.     DENTA 5000 PLUS 1.1 % CREA dental cream Place 1 Application onto teeth at bedtime.     hydrOXYzine  (ATARAX ) 25 MG tablet Take 1 tablet (25 mg total) by mouth 3 (three) times daily as needed for anxiety. 45 tablet 1   ibuprofen  (ADVIL ) 600 MG tablet Take 1 tablet (600 mg total) by mouth every 8 (eight) hours as needed. 30 tablet 0   pantoprazole  (PROTONIX ) 40 MG tablet TAKE 1 TABLET BY MOUTH DAILY 90 tablet 1  sertraline  (ZOLOFT ) 100 MG tablet Take 2 tablets (200 mg total) by mouth 2 (two) times daily. 60 tablet 1   Vitamin D , Ergocalciferol , (DRISDOL ) 1.25 MG (50000 UNIT) CAPS capsule Take 1 capsule (50,000 Units total) by mouth every 7 (seven) days. 12 capsule 0   No current facility-administered medications for this visit.     Musculoskeletal: Strength & Muscle Tone: Unable to assess via virtual visit Gait & Station: Unable to assess via virtual visit Patient leans: N/A  Psychiatric Specialty Exam: Review of Systems  Constitutional:        No other complaints voiced at this time  Psychiatric/Behavioral:  Positive for dysphoric mood (Improved) and sleep disturbance (Improved). Negative for hallucinations and self-injury. Suicidal ideas: Denies active and passive suicidal ideation at this time.The patient is nervous/anxious (Improved but could be better).   All other systems reviewed and are negative.   There were no vitals taken for this visit.There is no height or weight on file to calculate BMI.  General Appearance: Casual  Eye Contact:  Good  Speech:  Clear and Coherent and Normal Rate  Volume:  Normal  Mood:  Euthymic  Affect:  Appropriate and Congruent  Thought Process:  Coherent, Goal Directed, and Descriptions of Associations: Intact  Orientation:  Full (Time,  Place, and Person)  Thought Content: Logical   Suicidal Thoughts:  No  Homicidal Thoughts:  No  Memory:  Immediate;   Good Recent;   Good Remote;   Good  Judgement:  Intact  Insight:  Present  Psychomotor Activity:  Normal  Concentration:  Concentration: Good and Attention Span: Good  Recall:  Good  Fund of Knowledge: Good  Language: Good  Akathisia:  No  Handed:  Right  AIMS (if indicated): not done  Assets:  Communication Skills Desire for Improvement Housing Leisure Time Resilience Social Support Transportation  ADL's:  Intact  Cognition: WNL  Sleep:  Good   Screenings: GAD-7    Loss adjuster, chartered Office Visit from 07/13/2024 in [facility] Office Visit from 04/09/2024 in [facility] [place] [employer] Visit from 10/16/2023 in [facility] [employer] [facility] [place] [employer] from 03/27/2023 in [facility] [employer] [place] [employer] from 03/06/2023 in [facility] [employer] [facility] [place]  Total GAD-7 Score 20 18 0 7 16   PHQ2-9    Flowsheet Row Office Visit from 07/13/2024 in [facility] at [facility] Office Visit from 04/09/2024 in [facility] [place] [employer] from 10/16/2023 in [facility] [employer] [facility] [place] [employer] from 06/23/2023 in [facility] [employer] [facility] [place] [employer] from 03/27/2023 in [facility] [place]  PHQ-2 Total Score 6 5 0 0 4  PHQ-9 Total Score 24 24 -- -- 17   Flowsheet Row Office Visit from 07/13/2024 in [facility] at [facility]  C-SSRS RISK CATEGORY No Risk     Assessment and Plan:  Assessment: Patient seen and examined as noted above. Summary: Today Nicholas Decker appears to be doing fairly well.  He reports over all he is doing well and has  noticed improvement in depression, anxiety, mood, and sleep.  He states current medication regimen is effectively managing his mental health but could be some improvement in anxiety because small thing still gets him upset or anxious.  He denies suicidal/self-harm/homicidal ideation, psychosis, paranoia, and abnormal movements. During visit he is dressed appropriate for age and weather.  He is seated comfortably in view of camera with no noted distress.  He is alert/oriented x 4, calm/cooperative and mood is congruent with affect.  He spoke in a clear tone at moderate volume, and normal pace, with good eye contact.  His thought process is coherent, relevant, and there is no indication that he is currently responding to internal/external stimuli or experiencing delusional thought content.    1. GAD (generalized anxiety disorder) - busPIRone  (BUSPAR ) 7.5 MG tablet; Take 1 tablet (7.5 mg total) by mouth 3 (three) times daily.  Dispense: 90 tablet; Refill: 1 - hydrOXYzine  (ATARAX ) 25 MG tablet; Take 1 tablet (25 mg total) by mouth 3 (three) times daily as needed for anxiety.  Dispense: 45 tablet; Refill: 1 - sertraline  (ZOLOFT ) 100 MG tablet; Take 2 tablets (200 mg total) by mouth 2 (two) times daily.  Dispense: 60 tablet; Refill: 1  2. Insomnia, unspecified type - hydrOXYzine  (ATARAX ) 25 MG tablet; Take 1 tablet (25 mg total) by mouth 3 (three) times daily as needed for anxiety.  Dispense: 45 tablet; Refill: 1  3. Severe episode of recurrent major depressive disorder, without psychotic features (HCC) (Primary) - sertraline  (ZOLOFT ) 100 MG tablet; Take 2 tablets (200 mg total) by mouth 2 (two) times daily.  Dispense: 60 tablet; Refill: 1 - ARIPiprazole  (ABILIFY ) 2 MG tablet; Take 1 tablet (2 mg total) by mouth daily.  Dispense: 30 tablet; Refill: 1  Plan: Medications: Meds ordered this encounter  Medications   busPIRone  (BUSPAR ) 7.5 MG tablet    Sig: Take 1 tablet (7.5 mg total) by mouth 3 (three)  times daily.    Dispense:  90 tablet    Refill:  1    Supervising Provider:   ARFEEN, SYED T [[id number]]   hydrOXYzine  (ATARAX ) 25 MG tablet    Sig: Take 1 tablet (25 mg total) by mouth 3 (three) times daily as needed for anxiety.    Dispense:  45 tablet    Refill:  1    Supervising Provider:   ARFEEN, SYED T [[id number]]   sertraline  (ZOLOFT ) 100 MG tablet    Sig: Take 2 tablets (200 mg total) by mouth 2 (two) times daily.    Dispense:  60 tablet    Refill:  1    Supervising Provider:   CURRY, SYED T [[id number]]   ARIPiprazole  (ABILIFY ) 2 MG tablet    Sig: Take 1 tablet (2 mg total) by mouth daily.    Dispense:  30 tablet    Refill:  1    Supervising Provider:   CURRY LENI DASEN [[id number]]    Labs:  Not indicated at this time  Other:  Keep scheduled appointment with Nicholas Sink, LCSW on August 25, 2024 at 2 PM Nicholas Decker is instructed to call [phone], mobile crisis, or present to the nearest emergency room should he experience any suicidal/homicidal ideation, auditory/visual/hallucinations, or detrimental worsening of his mental health condition.   Nicholas Decker participated in the development of this treatment plan and verbalized his understanding/agreement with plan as listed.  Follow Up: Return in 1 month for medication management Call in the interim for any side-effects, decompensation, questions, or problems  Collaboration of Care: Collaboration of Care: Medication Management AEB medication assessment, adjustment, and refills and Referral or follow-up with counselor/therapist AEB follow-up on referral for counseling/therapy  Patient/Guardian was advised Release of Information must be obtained prior  to any record release in order to collaborate their care with an outside provider. Patient/Guardian was advised if they have not already done so to contact the registration department to sign all necessary forms in order for us  to release information regarding their care.   Consent:  Patient/Guardian gives verbal consent for treatment and assignment of benefits for services provided during this visit. Patient/Guardian expressed understanding and agreed to proceed.    Nicholas Back, NP 08/04/2024, 5:14 PM

## 2024-08-07 ENCOUNTER — Other Ambulatory Visit (HOSPITAL_COMMUNITY): Payer: Self-pay | Admitting: Psychiatry

## 2024-08-10 ENCOUNTER — Other Ambulatory Visit (HOSPITAL_COMMUNITY): Payer: Self-pay | Admitting: Psychiatry

## 2024-08-10 ENCOUNTER — Ambulatory Visit (HOSPITAL_COMMUNITY): Admitting: Psychiatry

## 2024-08-14 ENCOUNTER — Telehealth: Payer: Self-pay | Admitting: Family Medicine

## 2024-08-14 DIAGNOSIS — B029 Zoster without complications: Secondary | ICD-10-CM

## 2024-08-14 MED ORDER — VALACYCLOVIR HCL 1 G PO TABS: 1000.0000 mg | ORAL_TABLET | Freq: Three times a day (TID) | ORAL | 0 refills | Status: AC

## 2024-08-14 NOTE — Progress Notes (Signed)
 Virtual Visit Consent   Nicholas Decker, you are scheduled for a virtual visit with a [facility] provider today. Just as with appointments in the office, your consent must be obtained to participate. Your consent will be active for this visit and any virtual visit you may have with one of our providers in the next 365 days. If you have a MyChart account, a copy of this consent can be sent to you electronically.  As this is a virtual visit, video technology does not allow for your provider to perform a traditional examination. This may limit your provider's ability to fully assess your condition. If your provider identifies any concerns that need to be evaluated in person or the need to arrange testing (such as labs, EKG, etc.), we will make arrangements to do so. Although advances in technology are sophisticated, we cannot ensure that it will always work on either your end or our end. If the connection with a video visit is poor, the visit may have to be switched to a telephone visit. With either a video or telephone visit, we are not always able to ensure that we have a secure connection.  By engaging in this virtual visit, you consent to the provision of healthcare and authorize for your insurance to be billed (if applicable) for the services provided during this visit. Depending on your insurance coverage, you may receive a charge related to this service.  I need to obtain your verbal consent now. Are you willing to proceed with your visit today? Nicholas Decker has provided verbal consent on 08/14/2024 for a virtual visit (video or telephone). Nicholas Decker, [place]  Date: 08/14/2024 4:02 PM   Virtual Visit via Video Note   Nicholas Decker, connected with  Nicholas Decker  ([id number], [birth year]) on 08/14/24 at  4:00 PM EDT by a video-enabled telemedicine application and verified that I am speaking with the correct person using two identifiers.  Location: Patient: Virtual Visit Location Patient:  Home Provider: Virtual Visit Location Provider: Home Office   I discussed the limitations of evaluation and management by telemedicine and the availability of in person appointments. The patient expressed understanding and agreed to proceed.    History of Present Illness: Nicholas Decker is a 38 y.o. who identifies as a male who was assigned male at birth, and is being seen today for c/o it looks like I have shingles.  Pt states it is on the right side of his body, shoulders neck and back with blistering.  Pt states the area burns and is feeling cold. Pt states the rash started a week ago and he thought it was a bug bite and thought it was an allergic reaction but it is solely located on the right side and no where else on his body.  Pt describes it as a really bad sunburn.   HPI: HPI  Problems:  Patient Active Problem List   Diagnosis Date Noted   Genetic testing 06/16/2024   Dysthymia 01/06/2019   Obesity (BMI 30-39.9) 06/22/2017    Allergies: No Known Allergies Medications:  Current Outpatient Medications:    valACYclovir  (VALTREX ) 1000 MG tablet, Take 1 tablet (1,000 mg total) by mouth 3 (three) times daily for 7 days., Disp: 21 tablet, Rfl: 0   albuterol  (VENTOLIN  HFA) 108 (90 Base) MCG/ACT inhaler, Inhale 2 puffs into the lungs every 6 (six) hours as needed for wheezing or shortness of breath., Disp: 8 g, Rfl: 0   ARIPiprazole  (ABILIFY ) 2 MG tablet, Take 1 tablet (  2 mg total) by mouth daily., Disp: 30 tablet, Rfl: 1   busPIRone  (BUSPAR ) 7.5 MG tablet, Take 1 tablet (7.5 mg total) by mouth 3 (three) times daily., Disp: 90 tablet, Rfl: 1   cetirizine (ZYRTEC) 10 MG tablet, Take 10 mg by mouth daily., Disp: , Rfl:    DENTA 5000 PLUS 1.1 % CREA dental cream, Place 1 Application onto teeth at bedtime., Disp: , Rfl:    hydrOXYzine  (ATARAX ) 25 MG tablet, Take 1 tablet (25 mg total) by mouth 3 (three) times daily as needed for anxiety., Disp: 45 tablet, Rfl: 1   ibuprofen  (ADVIL ) 600 MG  tablet, Take 1 tablet (600 mg total) by mouth every 8 (eight) hours as needed., Disp: 30 tablet, Rfl: 0   pantoprazole  (PROTONIX ) 40 MG tablet, TAKE 1 TABLET BY MOUTH DAILY, Disp: 90 tablet, Rfl: 1   sertraline  (ZOLOFT ) 100 MG tablet, Take 2 tablets (200 mg total) by mouth 2 (two) times daily., Disp: 60 tablet, Rfl: 1   Vitamin D , Ergocalciferol , (DRISDOL ) 1.25 MG (50000 UNIT) CAPS capsule, Take 1 capsule (50,000 Units total) by mouth every 7 (seven) days., Disp: 12 capsule, Rfl: 0  Observations/Objective: Patient is well-developed, well-nourished in no acute distress.  Resting comfortably at home.  Head is normocephalic, atraumatic.  No labored breathing.  Speech is clear and coherent with logical content.  Patient is alert and oriented at baseline.    Assessment and Plan: 1. Herpes zoster without complication (Primary) - valACYclovir  (VALTREX ) 1000 MG tablet; Take 1 tablet (1,000 mg total) by mouth 3 (three) times daily for 7 days.  Dispense: 21 tablet; Refill: 0  -Start Valacyclovir  -Pt advised to follow up in person with PCP or urgent care for worsening symptoms.   Follow Up Instructions: I discussed the assessment and treatment plan with the patient. The patient was provided an opportunity to ask questions and all were answered. The patient agreed with the plan and demonstrated an understanding of the instructions.  A copy of instructions were sent to the patient via MyChart unless otherwise noted below.    The patient was advised to call back or seek an in-person evaluation if the symptoms worsen or if the condition fails to improve as anticipated.    Nicholas Mater, PA-C

## 2024-08-14 NOTE — Patient Instructions (Signed)
 Stevenson Swingler, thank you for joining Roosvelt Mater, PA-C for today's virtual visit.  While this provider is not your primary care provider (PCP), if your PCP is located in our provider database this encounter information will be shared with them immediately following your visit.   A [facility] MyChart account gives you access to today's visit and all your visits, tests, and labs performed at [facility]  click here if you don't have a [facility] MyChart account or go to [email or web address]  Consent: (Patient) Nicholas Decker provided verbal consent for this virtual visit at the beginning of the encounter.  Current Medications:  Current Outpatient Medications:    valACYclovir  (VALTREX ) 1000 MG tablet, Take 1 tablet (1,000 mg total) by mouth 3 (three) times daily for 7 days., Disp: 21 tablet, Rfl: 0   albuterol  (VENTOLIN  HFA) 108 (90 Base) MCG/ACT inhaler, Inhale 2 puffs into the lungs every 6 (six) hours as needed for wheezing or shortness of breath., Disp: 8 g, Rfl: 0   ARIPiprazole  (ABILIFY ) 2 MG tablet, Take 1 tablet (2 mg total) by mouth daily., Disp: 30 tablet, Rfl: 1   busPIRone  (BUSPAR ) 7.5 MG tablet, Take 1 tablet (7.5 mg total) by mouth 3 (three) times daily., Disp: 90 tablet, Rfl: 1   cetirizine (ZYRTEC) 10 MG tablet, Take 10 mg by mouth daily., Disp: , Rfl:    DENTA 5000 PLUS 1.1 % CREA dental cream, Place 1 Application onto teeth at bedtime., Disp: , Rfl:    hydrOXYzine  (ATARAX ) 25 MG tablet, Take 1 tablet (25 mg total) by mouth 3 (three) times daily as needed for anxiety., Disp: 45 tablet, Rfl: 1   ibuprofen  (ADVIL ) 600 MG tablet, Take 1 tablet (600 mg total) by mouth every 8 (eight) hours as needed., Disp: 30 tablet, Rfl: 0   pantoprazole  (PROTONIX ) 40 MG tablet, TAKE 1 TABLET BY MOUTH DAILY, Disp: 90 tablet, Rfl: 1   sertraline  (ZOLOFT ) 100 MG tablet, Take 2 tablets (200 mg total) by mouth 2 (two) times daily., Disp: 60 tablet, Rfl: 1   Vitamin D ,  Ergocalciferol , (DRISDOL ) 1.25 MG (50000 UNIT) CAPS capsule, Take 1 capsule (50,000 Units total) by mouth every 7 (seven) days., Disp: 12 capsule, Rfl: 0   Medications ordered in this encounter:  Meds ordered this encounter  Medications   valACYclovir  (VALTREX ) 1000 MG tablet    Sig: Take 1 tablet (1,000 mg total) by mouth 3 (three) times daily for 7 days.    Dispense:  21 tablet    Refill:  0     *If you need refills on other medications prior to your next appointment, please contact your pharmacy*  Follow-Up: Call back or seek an in-person evaluation if the symptoms worsen or if the condition fails to improve as anticipated.  [facility] [phone]  Other Instructions Shingles  Shingles, or herpes zoster, is an infection. It gives you a skin rash and blisters. These infected areas may hurt a lot. Shingles only happens if: You've had chickenpox. You've been given a shot called a vaccine to protect you from getting chickenpox. Shingles is rare in this case. What are the causes? Shingles is caused by a germ called the varicella-zoster virus. This is the same germ that causes chickenpox. After you're exposed to the germ, it stays in your body but is dormant. This means it isn't active. Shingles happens if the germ becomes active again. This can happen years after you're first exposed to the germ. What increases the risk?  You may be more likely to get shingles if: You're older than 37 years of age. You're under a lot of stress. You have a weak immune system. The immune system is your body's defense system. It may be weak if: You have human immunodeficiency virus (HIV). You have acquired immunodeficiency syndrome (AIDS). You have cancer. You take medicines that weaken your immune system. These include organ transplant medicines. What are the signs or symptoms? The first symptoms of shingles may be itching, tingling, or pain. Your skin may feel like it's burning. A  few days or weeks later, you'll get a rash. Here's what you can expect: The rash is likely to be on one side of your body. The rash may be shaped like a belt or a band. Over time, it will turn into blisters filled with fluid. The blisters will break open and change into scabs. The scabs will dry up in about 2-3 weeks. You may also have: A fever. Chills. A headache. Nausea. How is this diagnosed? Shingles is diagnosed with a skin exam. A sample called a culture may be taken from one of your blisters and sent to a lab. This will show if you have shingles. How is this treated? The rash may last for several weeks. There's no cure for shingles, but your health care provider may give you medicines. These medicines may: Help with pain. Help with itching. Help with irritation and swelling. Help you get better sooner. Help to prevent long-term problems. If the rash is on your face, you may need to see an eye doctor or an ear, nose, and throat (ENT) doctor. Follow these instructions at home: Medicines Take your medicines only as told by your provider. Put an anti-itch cream or numbing cream on the rash or blisters as told by your provider. Relieving itching and discomfort  To help with itching: Put cold, wet cloths called cold compresses on the rash or blisters. Take a cool bath. Try adding baking soda or dry oatmeal to the water. Do not bathe in hot water. Use calamine lotion on the rash or blisters. You can get this type of lotion at the store. Blister and rash care Keep your rash covered with a loose bandage. Wear loose clothes that don't rub on your rash. Take care of your rash as told by your provider. Make sure you: Wash your hands with soap and water for at least 20 seconds before and after you change your bandage. If you can't use soap and water, use hand sanitizer. Keep your rash and blisters clean by washing them with mild soap and cool water. Change your bandage. Check your rash  every day for signs of infection. Check for: More redness, swelling, or pain. Fluid or blood. Warmth. Pus or a bad smell. Do not scratch your rash. Do not pick at your blisters. To help you not scratch: Keep your fingernails clean and cut short. Try to wear gloves or mittens when you sleep. General instructions Rest. Wash your hands often with soap and water for at least 20 seconds. If you can't use soap and water, use hand sanitizer. Washing your hands lowers your chance of getting a skin infection. Your infection can cause chickenpox in others. If you have blisters that aren't scabs yet, stay away from: Babies. Pregnant people. Children who have eczema. Older people who have organ transplants. People who have a long-term, or chronic, illness. Anyone who hasn't had chickenpox before. Anyone who hasn't gotten the chickenpox vaccine. How is this  prevented? Vaccines are the best way to prevent you from getting chickenpox or shingles. Talk with your provider about getting these shots. Where to find more information Centers for Disease Control and Prevention (CDC): TonerPromos.no Contact a health care provider if: Your pain doesn't get better with medicine. Your pain doesn't get better after the rash heals. You have any signs of infection around the rash. Your rash or blisters get worse. You have a fever or chills. Get help right away if: The rash is on your face or nose. You have pain in your face or by your eye. You lose feeling on one side of your face. You have trouble seeing. You have ear pain or ringing in your ear. This information is not intended to replace advice given to you by your health care provider. Make sure you discuss any questions you have with your health care provider. Document Revised: 09/18/2023 Document Reviewed: 01/31/2023 Elsevier Patient Education  2024 [employer]   If you have been instructed to have an in-person evaluation today at a local Urgent Care  facility, please use the link below. It will take you to a list of all of our available [facility], including address, phone number and hours of operation. Please do not delay care.  [facility]  If you or a family member do not have a primary care provider, use the link below to schedule a visit and establish care. When you choose a [facility] primary care physician or advanced practice provider, you gain a long-term partner in health. Find a Primary Care Provider  Learn more about [facility]'s in-office and virtual care options: [facility] - Get Care Now

## 2024-08-25 ENCOUNTER — Ambulatory Visit (HOSPITAL_COMMUNITY): Admitting: Licensed Clinical Social Worker

## 2024-08-27 ENCOUNTER — Telehealth: Payer: Self-pay | Admitting: Neurology

## 2024-08-27 NOTE — Telephone Encounter (Signed)
 PT called to request to speak to Pt  MD due to not using Cpap Machine . PT states that he had shingle and could not use the Machine for 2 week due to having shingle .Pt wanted to follow up  with Md about this

## 2024-08-31 NOTE — Telephone Encounter (Signed)
 I called pt .  He is noted to have shingles. Back/should neck from 08-14-2024.  Took acyclovir for a week, and is better,  started using the machine again last night. (Still some twinges of pain).  He wanted to let us  know, keep us  in the loop.  He states still having some issue with waking the thought if he needed an tweaking of machine settings to assist.  I told him that would send to Dr. Buck and get back with him tomorrow when she is back in the office.  He appreciated concern.SABRA

## 2024-09-01 NOTE — Telephone Encounter (Signed)
 Thanks for the update. Recommend continuing with current APAP settings and use as consistently as possible.

## 2024-09-08 ENCOUNTER — Encounter (HOSPITAL_COMMUNITY): Payer: Self-pay | Admitting: Registered Nurse

## 2024-09-08 ENCOUNTER — Telehealth (INDEPENDENT_AMBULATORY_CARE_PROVIDER_SITE_OTHER): Admitting: Registered Nurse

## 2024-09-08 DIAGNOSIS — G4700 Insomnia, unspecified: Secondary | ICD-10-CM

## 2024-09-08 DIAGNOSIS — F411 Generalized anxiety disorder: Secondary | ICD-10-CM

## 2024-09-08 DIAGNOSIS — F332 Major depressive disorder, recurrent severe without psychotic features: Secondary | ICD-10-CM

## 2024-09-08 MED ORDER — HYDROXYZINE HCL 25 MG PO TABS: 25.0000 mg | ORAL_TABLET | Freq: Three times a day (TID) | ORAL | 1 refills | Status: DC | PRN

## 2024-09-08 MED ORDER — ARIPIPRAZOLE 5 MG PO TABS: 5.0000 mg | ORAL_TABLET | Freq: Every day | ORAL | 1 refills | Status: DC

## 2024-09-08 MED ORDER — BUSPIRONE HCL 10 MG PO TABS: 10.0000 mg | ORAL_TABLET | Freq: Three times a day (TID) | ORAL | 1 refills | Status: DC

## 2024-09-08 MED ORDER — SERTRALINE HCL 100 MG PO TABS: 200.0000 mg | ORAL_TABLET | Freq: Every day | ORAL | 1 refills | Status: DC

## 2024-09-08 MED ORDER — TRAZODONE HCL 50 MG PO TABS: 50.0000 mg | ORAL_TABLET | Freq: Every evening | ORAL | 1 refills | Status: DC | PRN

## 2024-09-08 NOTE — Progress Notes (Signed)
 [facility] MD/PA/NP OP Progress Note  09/08/2024 3:29 PM Nicholas Decker  MRN:  [id number]  Virtual Visit via Video Note  I connected with Nicholas Decker on 09/08/24 at  2:30 PM EDT by a video enabled telemedicine application and verified that I am speaking with the correct person using two identifiers.  Location: Patient: Home Provider: Home office   I discussed the limitations of evaluation and management by telemedicine and the availability of in person appointments. The patient expressed understanding and agreed to proceed.  I discussed the assessment and treatment plan with the patient. The patient was provided an opportunity to ask questions and all were answered. The patient agreed with the plan and demonstrated an understanding of the instructions.   The patient was advised to call back or seek an in-person evaluation if the symptoms worsen or if the condition fails to improve as anticipated.  I provided 30 minutes of non-face-to-face time during this encounter.   Luisa Ruder, NP   Chief Complaint:  Chief Complaint  Patient presents with   Follow-up    Medication management   HPI: Nicholas Decker 38 y.o. male presents to office today for medication management follow up.  He is seen via virtual video visit by this provider, and chart reviewed on 09/08/24.  His psychiatric history is significant for general anxiety and major depression.  His mental health is currently managed with Vistaril  25 mg Tid prn, Zoloft  200 mg daily, Abilify  2 mg daily, and Buspar  7.55 mg Tid.  He denies adverse reaction to current medications.  He reports there has been some improvement but feels not enough.  He reports having thoughts of feeling worthless and it's not because I am out of work.  Sometimes I feel like things will be better all if I was not here.  I know that is not true but that is what my brain is telling me.  He reports that he has no active plans of suicidal ideation.  Reports no intent or  plan.  Reports his wife is aware of depression and how he is feeling.  Reports medications are put up where he does not have access to them and his wife gives him the medications that are needed.  He reports there are no guns in the home and he does not have access to a gun.  He states he can tell that he has gotten better just slow progress.  He states his initial appointment for therapy was canceled related to the therapist being sick and next appointment is close to the end of this month.  At this time he denies active/passive suicidal ideation.  He also denies self-harm/homicidal ideation, psychosis, paranoia, and abnormal movement.  Screenings completed during today's visit PHQ-9, C-SSRS, GAD-7, AIMS, AUDIT, Nutrition, and Pain, see scores below.    Treatment options discussed: Zoloft  at max dose can increase in Buspar  to address anxiety/depression, also increased Abilify .  Since having difficulty with sleep can start trazodone .  Agrees to medication increases and trial of trazodone .  Will attempt to see if counseling/therapy can get him in sooner  Recommended the following: Continue Zoloft  200 mg daily and Vistaril  25 mg 3 times daily as needed, increase Abilify  5 mg daily, and BuSpar  10 mg 3 times daily.  Discussed efficacy/side effects of trazodone  and educational material also added to AVS.  He voices understanding/agreement with information/recommendations being given to him today.    Visit Diagnosis:    ICD-10-CM   1. Severe episode of recurrent major depressive  disorder, without psychotic features (HCC)  F33.2 busPIRone  (BUSPAR ) 10 MG tablet    sertraline  (ZOLOFT ) 100 MG tablet    ARIPiprazole  (ABILIFY ) 5 MG tablet    2. GAD (generalized anxiety disorder)  F41.1 busPIRone  (BUSPAR ) 10 MG tablet    hydrOXYzine  (ATARAX ) 25 MG tablet    sertraline  (ZOLOFT ) 100 MG tablet    3. Insomnia, unspecified type  G47.00 hydrOXYzine  (ATARAX ) 25 MG tablet    traZODone  (DESYREL ) 50 MG tablet       Past Psychiatric History:  Diagnosis: Major depression, general anxiety Suicide attempt: He reports several suicide attempts that occurred several months ago during a period at which he would get in his car and drive with his eyes closed. Non-suicidal self-injurious behavior: Denies Psychiatric hospitalization: Denies Substance abuse: Reports occasional use of CBD edibles Past psychotropic medication trials: Remeron, Wellbutrin   Past Medical History:  Past Medical History:  Diagnosis Date   Seasonal allergies    History reviewed. No pertinent surgical history.  Family Psychiatric History: See below and family history  Family History:  Family History  Problem Relation Age of Onset   Depression Mother    Thyroid disease Mother    Cancer Father        either throat or lung; mets to brain   Heart disease Maternal Grandmother    Heart disease Maternal Grandfather    Cancer Maternal Grandfather        unknown   Cancer Half-Brother 75       pancreatic   Congenital heart disease Half-Brother        d. 43 from flu   Breast cancer Other 29       maternal great aunt   Heart disease Half-Sibling    Sleep apnea Neg Hx     Social History:  Social History   Socioeconomic History   Marital status: Married    Spouse name: Not on file   Number of children: Not on file   Years of education: Not on file   Highest education level: Not on file  Occupational History   Not on file  Tobacco Use   Smoking status: Never   Smokeless tobacco: Never  Vaping Use   Vaping status: Never Used  Substance and Sexual Activity   Alcohol use: Yes    Comment: Social maybe twice a year   Drug use: No   Sexual activity: Yes    Partners: Female  Other Topics Concern   Not on file  Social History Narrative   Heritage manager   Wife is Marine scientist   No children   Social Drivers of Health   Financial Resource Strain: Not on file  Food Insecurity: Not on file   Transportation Needs: Not on file  Physical Activity: Not on file  Stress: Not on file  Social Connections: Unknown (10/07/2023)   [facility]   Social Network    Social Network: Not on file    Allergies: No Known Allergies  Metabolic Disorder Labs: Lab Results  Component Value Date   HGBA1C 5.4 04/09/2024   No results found for: PROLACTIN Lab Results  Component Value Date   CHOL 200 04/09/2024   TRIG 127.0 04/09/2024   HDL 38.30 (L) 04/09/2024   CHOLHDL 5 04/09/2024   VLDL 25.4 04/09/2024   LDLCALC 136 (H) 04/09/2024   LDLCALC 116 (H) 03/06/2023   Lab Results  Component Value Date   TSH 2.35 04/09/2024   TSH 1.75 06/17/2017  Current Medications: Current Outpatient Medications  Medication Sig Dispense Refill   traZODone  (DESYREL ) 50 MG tablet Take 1 tablet (50 mg total) by mouth at bedtime as needed for sleep. 30 tablet 1   albuterol  (VENTOLIN  HFA) 108 (90 Base) MCG/ACT inhaler Inhale 2 puffs into the lungs every 6 (six) hours as needed for wheezing or shortness of breath. 8 g 0   ARIPiprazole  (ABILIFY ) 5 MG tablet Take 1 tablet (5 mg total) by mouth daily. 30 tablet 1   busPIRone  (BUSPAR ) 10 MG tablet Take 1 tablet (10 mg total) by mouth 3 (three) times daily. 90 tablet 1   cetirizine (ZYRTEC) 10 MG tablet Take 10 mg by mouth daily.     DENTA 5000 PLUS 1.1 % CREA dental cream Place 1 Application onto teeth at bedtime.     hydrOXYzine  (ATARAX ) 25 MG tablet Take 1 tablet (25 mg total) by mouth 3 (three) times daily as needed for anxiety. 45 tablet 1   ibuprofen  (ADVIL ) 600 MG tablet Take 1 tablet (600 mg total) by mouth every 8 (eight) hours as needed. 30 tablet 0   pantoprazole  (PROTONIX ) 40 MG tablet TAKE 1 TABLET BY MOUTH DAILY 90 tablet 1   sertraline  (ZOLOFT ) 100 MG tablet Take 2 tablets (200 mg total) by mouth daily. 60 tablet 1   Vitamin D , Ergocalciferol , (DRISDOL ) 1.25 MG (50000 UNIT) CAPS capsule Take 1 capsule (50,000 Units total) by mouth  every 7 (seven) days. 12 capsule 0   No current facility-administered medications for this visit.     Musculoskeletal: Strength & Muscle Tone: Unable to assess via virtual visit Gait & Station: Unable to assess via virtual visit Patient leans: N/A  Psychiatric Specialty Exam: Review of Systems  Constitutional:        No other complaints voiced at this time  Psychiatric/Behavioral:  Positive for dysphoric mood and sleep disturbance. Negative for hallucinations and self-injury. Suicidal ideas: Denies active and passive suicidal ideation at this time.The patient is nervous/anxious.   All other systems reviewed and are negative.   There were no vitals taken for this visit.There is no height or weight on file to calculate BMI.  General Appearance: Casual  Eye Contact:  Good  Speech:  Clear and Coherent and Normal Rate  Volume:  Normal  Mood:  Depressed and Euthymic  Affect:  Appropriate and Congruent  Thought Process:  Coherent, Goal Directed, and Descriptions of Associations: Intact  Orientation:  Full (Time, Place, and Person)  Thought Content: Logical   Suicidal Thoughts:  No  Homicidal Thoughts:  No  Memory:  Immediate;   Good Recent;   Good Remote;   Good  Judgement:  Intact  Insight:  Present  Psychomotor Activity:  Normal  Concentration:  Concentration: Good and Attention Span: Good  Recall:  Good  Fund of Knowledge: Good  Language: Good  Akathisia:  No  Handed:  Right  AIMS (if indicated): not done  Assets:  Communication Skills Desire for Improvement Housing Leisure Time Resilience Social Support Transportation  ADL's:  Intact  Cognition: WNL  Sleep:  Good   Screenings: AIMS    Flowsheet Row Video Visit from 09/08/2024 in [facility] at [facility]  AIMS Total Score 0   GAD-7    Flowsheet Row Video Visit from 09/08/2024 in [facility] at [facility] Office Visit from  07/13/2024 in [facility] at [facility] Office Visit from 04/09/2024 in [facility] at  [facility] from 10/16/2023 in [facility] at [facility] Video Visit from 03/27/2023 in [facility] at [facility] [place]  Total GAD-7 Score 10 20 18  0 7   PHQ2-9    Flowsheet Row Video Visit from 09/08/2024 in [facility] at [facility] Office Visit from 07/13/2024 in [facility] Health at [facility] Office Visit from 04/09/2024 in [facility] at [facility] [place] Office Visit from 10/16/2023 in [facility] at [facility] from 06/23/2023 in [facility] at [facility] [place]  PHQ-2 Total Score 5 6 5  0 0  PHQ-9 Total Score 19 24 24  -- --   Flowsheet Row Video Visit from 09/08/2024 in [facility] at [facility] Office Visit from 07/13/2024 in [facility] Outpatient Behavioral Health at [facility]  C-SSRS RISK CATEGORY Low Risk No Risk   Assessment and Plan:  Assessment: Patient seen and examined as noted above. Summary: Today Nicholas Decker appears to be doing fairly well.  He reports continued symptoms of depression and anxiety.  States he can tell that there has been some improvement but very little.  He reports episodes of passive suicidal thoughts with no intent or plan just thoughts that things would be better off without him.  He reports he does not want to die.  He states that his wife manages his medications and he does not have access.  He reports there are no guns in the home and he does not have access to a gun. He agrees that if no improvement soon switching may need to switch to a different antidepressant.  At this time he denies suicidal/self-harm/homicidal ideation, psychosis, paranoia, and abnormal  movement. During visit he is dressed appropriate for age and weather.  He is seated comfortably in view of camera with no noted distress.  He is alert/oriented x 4, calm/cooperative and mood is congruent with affect.  He spoke in a clear tone at moderate volume, and normal pace, with good eye contact.  His thought process is coherent, relevant, and there is no indication that he is currently responding to internal/external stimuli or experiencing delusional thought content.    1. GAD (generalized anxiety disorder) - busPIRone  (BUSPAR ) 10 MG tablet; Take 1 tablet (10 mg total) by mouth 3 (three) times daily.  Dispense: 90 tablet; Refill: 1 - hydrOXYzine  (ATARAX ) 25 MG tablet; Take 1 tablet (25 mg total) by mouth 3 (three) times daily as needed for anxiety.  Dispense: 45 tablet; Refill: 1 - sertraline  (ZOLOFT ) 100 MG tablet; Take 2 tablets (200 mg total) by mouth daily.  Dispense: 60 tablet; Refill: 1  2. Insomnia, unspecified type - hydrOXYzine  (ATARAX ) 25 MG tablet; Take 1 tablet (25 mg total) by mouth 3 (three) times daily as needed for anxiety.  Dispense: 45 tablet; Refill: 1 - traZODone  (DESYREL ) 50 MG tablet; Take 1 tablet (50 mg total) by mouth at bedtime as needed for sleep.  Dispense: 30 tablet; Refill: 1  3. Severe episode of recurrent major depressive disorder, without psychotic features (HCC) (Primary) - busPIRone  (BUSPAR ) 10 MG tablet; Take 1 tablet (10 mg total) by mouth 3 (three) times daily.  Dispense: 90 tablet; Refill: 1 - sertraline  (ZOLOFT ) 100 MG tablet; Take 2 tablets (200 mg total) by mouth daily.  Dispense: 60 tablet; Refill: 1 - ARIPiprazole  (ABILIFY ) 5 MG tablet; Take 1 tablet (5 mg total) by  mouth daily.  Dispense: 30 tablet; Refill: 1   Plan: Medications: Meds ordered this encounter  Medications   busPIRone  (BUSPAR ) 10 MG tablet    Sig: Take 1 tablet (10 mg total) by mouth 3 (three) times daily.    Dispense:  90 tablet    Refill:  1    Supervising Provider:    CURRY, SYED T [[id number]]   hydrOXYzine  (ATARAX ) 25 MG tablet    Sig: Take 1 tablet (25 mg total) by mouth 3 (three) times daily as needed for anxiety.    Dispense:  45 tablet    Refill:  1    Supervising Provider:   CURRY, SYED T [[id number]]   sertraline  (ZOLOFT ) 100 MG tablet    Sig: Take 2 tablets (200 mg total) by mouth daily.    Dispense:  60 tablet    Refill:  1    Supervising Provider:   CURRY, SYED T [[id number]]   ARIPiprazole  (ABILIFY ) 5 MG tablet    Sig: Take 1 tablet (5 mg total) by mouth daily.    Dispense:  30 tablet    Refill:  1    Supervising Provider:   CURRY, SYED T [[id number]]   traZODone  (DESYREL ) 50 MG tablet    Sig: Take 1 tablet (50 mg total) by mouth at bedtime as needed for sleep.    Dispense:  30 tablet    Refill:  1    Supervising Provider:   CURRY LENI DASEN [[id number]]    Labs:  Not indicated at this time  Other:  Keep scheduled appointment with Ronal Sink, LCSW on 10/20/2024 at 2 PM.  Resources also given if decides to look elsewhere for counseling/therapy for sooner appointment date Nicholas Decker is instructed to call 911, [phone], mobile crisis, or present to the nearest emergency room should he experience any suicidal/homicidal ideation, auditory/visual/hallucinations, or detrimental worsening of his mental health condition.   Nicholas Decker participated in the development of this treatment plan and verbalized his understanding/agreement with plan as listed.  Follow Up: Return in 1 month for medication management Call in the interim for any side-effects, decompensation, questions, or problems  Collaboration of Care: Collaboration of Care: Medication Management AEB medication assessment, adjustment, and refills and Referral or follow-up with counselor/therapist AEB follow-up on referral for counseling/therapy  Patient/Guardian was advised Release of Information must be obtained prior to any record release in order to collaborate their care with an outside provider.  Patient/Guardian was advised if they have not already done so to contact the registration department to sign all necessary forms in order for us  to release information regarding their care.   Consent: Patient/Guardian gives verbal consent for treatment and assignment of benefits for services provided during this visit. Patient/Guardian expressed understanding and agreed to proceed.    Nicholas Moncrief, NP 09/08/2024, 3:29 PM

## 2024-09-08 NOTE — Patient Instructions (Addendum)
 If no one has contacted, you by the end of business day today please call the appropriate office listed below to schedule your next visit for medication management with Nicholas Ruder, NP:    [facility] [address], [place], [place] [address]  5.4 mi Phone: [phone] (Call to schedule appointment)  [facility] at [facility] [address] [place], [place] [address]  25 mi Phone: [phone] (Call to schedule appointment)   Call 911, [phone], mobile crisis, or present to the nearest emergency room should you experience any suicidal/homicidal ideation, auditory/visual/hallucinations, or detrimental worsening of your mental health.  Mobile Crisis Response Teams Listed by counties in vicinity of [facility] providers [facility] [phone] [facility] [phone] [facility] [phone] [facility] [phone] [place]                * [facility] [phone]                * [facility] [phone]  [facility] [phone] [facility] [employer]  [phone] * [facility] [phone]            Same-day appointments Operating hours and clinician availability may delay appointments until the next business day. [place]  New and Current Patients  Psychiatry hours Monday-Friday, 8am-4:30pm (Eastern Time) If you are experiencing problems accessing [facility] send email to: [email or web address] or call [phone], Monday-Friday, 8:00am-4:30pm If you are having a psychiatric or medical emergency, please call 911 or go to the nearest emergency department. To reach the Suicide and Crisis Lifeline, please call or text  988.  What is [facility]?  [facility] is an online service that provides same-day access to psychiatry to meet urgent mental health needs. The goal is to provide patients with timely intervention and keep them in an outpatient setting.  How long will I wait to see a clinician?  Our goal is to provide same-day care. However, operating hours and clinician availability may delay appointments until the next business day.  What if I can't get an appointment?  Please call [facility] at [phone] 8am to 5pm [place] Time or email us :  [email or web address]  to request the next available time. If you are having a psychiatric or medical emergency, please call 911 or go to the nearest emergency department. To reach the Suicide and Crisis Lifeline, please call or text [phone]. Do you accept insurance?  Yes. We accept most commercial insurance plans. What information do you need from me? New patients should be ready to provide:  Photo ID  Insurance card  Payment information  For current patients, our specialists will confirm your documentation is on file.   Can I continue with regular care after my [facility] session?  Yes. Our goal is to make sure you have the follow-up care you need. Our specialist can help you schedule an appointment with an ongoing provider in addition to your On Demand session.  Can my current clinician provide treatment on [facility]?  No. Our [facility] clinicians are trained to assist with more immediate needs and provide support between regular appointments with your clinician.  What device can I use to connect with [facility]?  You can use any Wi-Fi-enabled device with a camera and a microphone, such as a smartphone, tablet,  or computer.  Do I have to be at home to connect with [facility]?  No. As long as you are located within [place]  or [place]  you can connect with a provider. We do request that you  connect from a safe and private location. Your clinician is required to document your location for emergency purposes.  Insurance:  Nicholas Decker, [place], [employer], [place], [place], [place], [place], [employer], [employer]   [employer] [phone] [address], [address] [place], [place], [address] [phone] phone Completely online treatment platform Contact: Izola So - Behavioral Health Outreach Specialist [phone] phone [phone] fax        [facility] -- we've launched a new Substance Use Intensive Outpatient Program (IOP) in [place]  for adolescents and young adults aged 1-50.  This program is designed to address the growing need for accessible, comprehensive support for teens and young adults navigating substance use challenges. In addition to the core IOP programming, we're also offering Medication-Assisted Treatment (MAT) services for clients aged 27-50, delivered in a safe and supportive virtual environment.  Our licensed clinicians are trained in co-occurring disorders and will provide individualized care that includes:    Substance use-specific group therapy    Family therapy    Individual support    Psychiatric services when needed

## 2024-09-22 ENCOUNTER — Ambulatory Visit (INDEPENDENT_AMBULATORY_CARE_PROVIDER_SITE_OTHER): Admitting: Licensed Clinical Social Worker

## 2024-09-22 DIAGNOSIS — F411 Generalized anxiety disorder: Secondary | ICD-10-CM | POA: Diagnosis not present

## 2024-09-22 DIAGNOSIS — G4700 Insomnia, unspecified: Secondary | ICD-10-CM

## 2024-09-22 DIAGNOSIS — F332 Major depressive disorder, recurrent severe without psychotic features: Secondary | ICD-10-CM
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
## 2024-10-05 ENCOUNTER — Ambulatory Visit (INDEPENDENT_AMBULATORY_CARE_PROVIDER_SITE_OTHER): Admitting: Licensed Clinical Social Worker

## 2024-10-05 ENCOUNTER — Encounter (HOSPITAL_COMMUNITY): Payer: Self-pay

## 2024-10-05 DIAGNOSIS — G4700 Insomnia, unspecified: Secondary | ICD-10-CM

## 2024-10-05 DIAGNOSIS — F332 Major depressive disorder, recurrent severe without psychotic features: Secondary | ICD-10-CM

## 2024-10-05 DIAGNOSIS — F411 Generalized anxiety disorder: Secondary | ICD-10-CM | POA: Diagnosis not present

## 2024-10-05 NOTE — Progress Notes (Addendum)
 Virtual Visit via Video Note  I connected with [name] on 10/05/24 at  1:00 PM EDT by a video enabled telemedicine application and verified that I am speaking with the correct person using two identifiers.  Location: Patient:home Provider: office   I discussed the limitations of evaluation and management by telemedicine and the availability of in person appointments. The patient expressed understanding and agreed to proceed.  I discussed the assessment and treatment plan with the patient. The patient was provided an opportunity to ask questions and all were answered. The patient agreed with the plan and demonstrated an understanding of the instructions.   The patient was advised to call back or seek an in-person evaluation if the symptoms worsen or if the condition fails to improve as anticipated.  I provided 45 minutes of non-face-to-face time during this encounter.   THERAPIST PROGRESS NOTE  Session Time: 1:00 PM to 1:45 PM  Participation Level: Active  Behavioral Response: CasualAlertDepressed  Type of Therapy: Individual Therapy  Treatment Goals addressed: Depression, patient specifically wants thoughts to be less hurtful in his brain meaning negative thoughts wants to work on self-esteem and anxiety noted explored borderline symptoms to work on those to help with symptoms, coping-completed treatment plan virtually patient gave consent to complete virtually  ProgressTowards Goals: Progressing-as patient had very well we are developing a path to move forward identifying borderline symptoms depressive symptoms to look at CBT and DBT  Interventions: CBT, DBT, Solution Focused, Strength-based, Supportive, and Other: Coping  Summary: Nicholas Decker is a 38 y.o. male who presents with feel like suicidal tendencies have gone is a positive with medication right now feels hopeful about it. Feeling lethargic, hate the word lazy only word can think of now doesn't have the  motivation to do anything.  Therapist noted this is what she sees as depression one of the elements think also the unstructured time can impact him patient says even when working would want to come home and go to sleep and therapist noted she does to but lets look at strategies for depression.  Completed treatment plan patient gave consent to complete virtually therapist related wife thinks there are borderline personality behaviors and to share that with therapist so we reviewed the different criteria that he has to have 5 of them Noted in general instability and interpersonal relationships self-image and emotions along with marked impulsivity reviewed frantic efforts to avoid real or imagined abandonment patient says unstable relationships as afraid to call friends think he would be a burden think they will be upset if he contacts them.  Therapist sees identity disturbance particularly him thinking he is burden some is an inaccurate self perception explained that to patient how she sees it.  Patient does see impulsivity with reckless driving closes eyes and suicidal does not do that right now also masturbates a lot and not having sex therapist noted from her experience though not an expert too much masturbation is excessive needs also have a healthy relationship and if this gets in a way an issue.  Needs to be explored more he is attracted to his wife may be the medication, could be a biological issue but note first were working on having him feel better.  Suicide gestures again closing eyes when driving patient endorses affective instability wife says 3 days later he is different person was fine and then a different person patient endorses chronic feelings of emptiness he also describes inappropriate anger and describes how it lays out wife will cook  something not the way he likes it so refuses to eat working on it still has tendencies therapist noted all nothing thinking and that things are shades of gray and  she explains this by we will get everything you want have to except compromise patient says it is hard for him to see shades of gray noted this distortion impacts mental health so we will look at challenges to that although may show neuro divergently tendency patient has evaluation on the 28th patient also feels disconnected which is the criteria of transient stress-related paranoid ideation or severe dissociative symptoms looked at strategies for borderline that include DBT, mentalization based therapy, transference focused psychotherapy schema focused therapy which therapist thinks relates to schemas developed from past experience noting maladaptive core beliefs schemas formed in early life effective for improving self-image and reducing emotional instability and CBT.  Therapist started work on CBT depression this patient has no motivation and showed the cogs that keep it going include where are focus is doing less will look at that more in detail as we can help patient with no energy or motivation.  Reviewed session patient says think we have developing a path did not know before his brain was everywhere but building a path to move forward with and therapist noted adjusting as we go as we figure this out     Suicidal/Homicidal: No  Plan: Return again in 3 weeks.2.  Look at CBT for depression look at thoughts and feelings book look at therapist worksheets for DBT.3.  Worksheet therapist develop about patient's thoughts of being burdensome  Diagnosis: Major depressive disorder recurrent severe, generalized anxiety disorder insomnia unspecified  Collaboration of Care: Other none needed  Patient/Guardian was advised Release of Information must be obtained prior to any record release in order to collaborate their care with an outside provider. Patient/Guardian was advised if they have not already done so to contact the registration department to sign all necessary forms in order for us  to release information  regarding their care.   Consent: Patient/Guardian gives verbal consent for treatment and assignment of benefits for services provided during this visit. Patient/Guardian expressed understanding and agreed to proceed.   Ronal Sink, LCSW 10/05/2024

## 2024-10-06 ENCOUNTER — Telehealth (HOSPITAL_COMMUNITY): Admitting: Registered Nurse

## 2024-10-12 ENCOUNTER — Telehealth: Payer: Self-pay | Admitting: Adult Health

## 2024-10-12 NOTE — Telephone Encounter (Signed)
 [facility] conf

## 2024-10-13 ENCOUNTER — Encounter: Payer: Self-pay | Admitting: Physician Assistant

## 2024-10-13 ENCOUNTER — Ambulatory Visit (INDEPENDENT_AMBULATORY_CARE_PROVIDER_SITE_OTHER): Admitting: Physician Assistant

## 2024-10-13 VITALS — BP 118/80 | HR 90 | Temp 98.8°F | Ht 71.0 in | Wt 325.5 lb

## 2024-10-13 DIAGNOSIS — F411 Generalized anxiety disorder: Secondary | ICD-10-CM

## 2024-10-13 DIAGNOSIS — F410 Panic disorder [episodic paroxysmal anxiety] without agoraphobia: Secondary | ICD-10-CM

## 2024-10-13 DIAGNOSIS — Z0000 Encounter for general adult medical examination without abnormal findings: Secondary | ICD-10-CM

## 2024-10-13 DIAGNOSIS — F332 Major depressive disorder, recurrent severe without psychotic features: Secondary | ICD-10-CM | POA: Diagnosis not present

## 2024-10-13 DIAGNOSIS — G4733 Obstructive sleep apnea (adult) (pediatric): Secondary | ICD-10-CM

## 2024-10-13 DIAGNOSIS — E559 Vitamin D deficiency, unspecified: Secondary | ICD-10-CM

## 2024-10-13 DIAGNOSIS — K219 Gastro-esophageal reflux disease without esophagitis: Secondary | ICD-10-CM

## 2024-10-13 MED ORDER — LORAZEPAM 0.5 MG PO TABS: 0.5000 mg | ORAL_TABLET | Freq: Two times a day (BID) | ORAL | 1 refills | Status: AC | PRN

## 2024-10-13 MED ORDER — VITAMIN D (ERGOCALCIFEROL) 1.25 MG (50000 UNIT) PO CAPS: 50000.0000 [IU] | ORAL_CAPSULE | ORAL | 0 refills | Status: AC

## 2024-10-13 NOTE — Progress Notes (Signed)
 Subjective:    Nicholas Decker is a 38 y.o. male and is here for a comprehensive physical exam.  HPI  There are no preventive care reminders to display for this patient.  Discussed the use of AI scribe software for clinical note transcription with the patient, who gave verbal consent to proceed.  History of Present Illness   Nicholas Decker is a 38 year old male who presents for Comprehensive Physical Exam (CPE) preventive care annual visit.  He uses a CPAP machine with a full mask due to a deviated septum and has difficulty adjusting to it because of movement during sleep. The mask's sponge-like material does not irritate his facial hair. This is his first mask.  He attends therapy sessions biweekly and has had two sessions, feeling a connection with the therapist. He had a negative experience with a previous psychiatrist and is now seeing another psychiatrist in the same office. He is consulting with the [facility] for potential ADHD.  He experiences significant anxiety, especially when his wife is driving, leading to panic attacks. He drives cautiously and feels anxious even on short trips. He takes hydroxyzine  25 mg, which is sedating but slow to act, and buspirone  7.5 mg twice daily, which helps with general anxiety but not immediately.        Health Maintenance: Immunizations -- has received flu shot Colonoscopy -- n/a PSA --  Lab Results  Component Value Date   PSA 0.36 06/17/2017   Diet -- overall healthy Sleep habits -- no major concerns Exercise -- limited  Weight -- Weight: (!) 325 lb 8 oz (147.6 kg)  Recent weight history Wt Readings from Last 10 Encounters:  10/13/24 (!) 325 lb 8 oz (147.6 kg)  05/19/24 (!) 326 lb (147.9 kg)  04/09/24 (!) 312 lb 4 oz (141.6 kg)  10/16/23 298 lb 6.4 oz (135.4 kg)  06/23/23 295 lb 9.6 oz (134.1 kg)  03/27/23 (!) 302 lb (137 kg)  03/06/23 (!) 302 lb 6.1 oz (137.2 kg)  05/30/22 280 lb (127 kg)  10/01/19 267  lb (121.1 kg)  01/06/19 279 lb 6.1 oz (126.7 kg)   Body mass index is 45.4 kg/m.  Mood -- see above Alcohol use --  reports current alcohol use.  Tobacco use --  Tobacco Use: Low Risk  (10/13/2024)   Patient History    Smoking Tobacco Use: Never    Smokeless Tobacco Use: Never    Passive Exposure: Not on file    Eligible for Low Dose CT? no  UTD with eye doctor? yes UTD with dentist? yes     10/13/2024   10:49 AM  Depression screen PHQ 2/9  Decreased Interest 3  Down, Depressed, Hopeless 2  PHQ - 2 Score 5  Altered sleeping 3  Tired, decreased energy 3  Change in appetite 3  Feeling bad or failure about yourself  2  Trouble concentrating 3  Moving slowly or fidgety/restless 2  Suicidal thoughts 1  PHQ-9 Score 22  Difficult doing work/chores Extremely dIfficult    Other providers/specialists: Patient Care Team: Job Lukes, [place] as PCP - General (Physician Assistant)    PMHx, SurgHx, SocialHx, Medications, and Allergies were reviewed in the Visit Navigator and updated as appropriate.   Past Medical History:  Diagnosis Date   Seasonal allergies     No past surgical history on file.   Family History  Problem Relation Age of Onset   Depression Mother    Thyroid disease Mother  Cancer Father        either throat or lung; mets to brain   Heart disease Maternal Grandmother    Heart disease Maternal Grandfather    Cancer Maternal Grandfather        unknown   Cancer Half-Brother 33       pancreatic   Congenital heart disease Half-Brother        d. 83 from flu   Breast cancer Other 13       maternal great aunt   Heart disease Half-Sibling    Sleep apnea Neg Hx     Social History   Tobacco Use   Smoking status: Never   Smokeless tobacco: Never  Vaping Use   Vaping status: Never Used  Substance Use Topics   Alcohol use: Yes    Comment: Social maybe twice a year   Drug use: No    Review of Systems:   Review of Systems  Constitutional:   Negative for chills, fever, malaise/fatigue and weight loss.  HENT:  Negative for hearing loss, sinus pain and sore throat.   Respiratory:  Negative for cough and hemoptysis.   Cardiovascular:  Negative for chest pain, palpitations, leg swelling and PND.  Gastrointestinal:  Negative for abdominal pain, constipation, diarrhea, heartburn, nausea and vomiting.  Genitourinary:  Negative for dysuria, frequency and urgency.  Musculoskeletal:  Negative for back pain, myalgias and neck pain.  Skin:  Negative for itching and rash.  Neurological:  Negative for dizziness, tingling, seizures and headaches.  Endo/Heme/Allergies:  Negative for polydipsia.  Psychiatric/Behavioral:  Negative for depression. The patient is not nervous/anxious.     Objective:    Vitals:   10/13/24 1007  BP: 118/80  Pulse: 90  Temp: 98.8 F (37.1 C)  SpO2: 96%    Body mass index is 45.4 kg/m.  General  Alert, cooperative, no distress, appears stated age  Head:  Normocephalic, without obvious abnormality, atraumatic  Eyes:  PERRL, conjunctiva/corneas clear, EOM's intact, fundi benign, both eyes       Ears:  Normal TM's and external ear canals, both ears  Nose: Nares normal, septum midline, mucosa normal, no drainage or sinus tenderness  Throat: Lips, mucosa, and tongue normal; teeth and gums normal  Neck: Supple, symmetrical, trachea midline, no adenopathy;     thyroid:  No enlargement/tenderness/nodules; no carotid bruit or JVD  Back:   Symmetric, no curvature, ROM normal, no CVA tenderness  Lungs:   Clear to auscultation bilaterally, respirations unlabored  Chest wall:  No tenderness or deformity  Heart:  Regular rate and rhythm, S1 and S2 normal, no murmur, rub or gallop  Abdomen:   Soft, non-tender, bowel sounds active all four quadrants, no masses, no organomegaly  Extremities: Extremities normal, atraumatic, no cyanosis or edema  Prostate : deferred   Skin: Skin color, texture, turgor normal, no rashes  or lesions  Lymph nodes: Cervical, supraclavicular, and axillary nodes normal  Neurologic: CNII-XII grossly intact. Normal strength, sensation and reflexes throughout   AssessmentPlan:   Assessment and Plan    Comprehensive Physical Exam (CPE) preventive care annual visit Today patient counseled on age appropriate routine health concerns for screening and prevention, each reviewed and up to date or declined. Immunizations reviewed and up to date or declined. Labs ordered and reviewed. Risk factors for depression reviewed and negative. Hearing function and visual acuity are intact. ADLs screened and addressed as needed. Functional ability and level of safety reviewed and appropriate. Education, counseling and referrals performed based on  assessed risks today. Patient provided with a copy of personalized plan for preventive services.  Generalized anxiety disorder; Panic attacks Significant anxiety and panic attacks, especially when his wife drives. Hydroxyzine  and buspirone  ineffective for immediate relief. - Prescribe Ativan for situational anxiety. - Send a note to the psychiatrist regarding the prescription of Ativan. - Discuss Ativan use with the psychiatrist at the next appointment.  Severe episode of recurrent major depressive disorder, without psychotic features (HCC)  Attending therapy biweekly. Mixed experiences with psychiatrists. Not working since March due to mental health issues.  Obstructive sleep apnea Using CPAP with full mask due to deviated septum. Difficulty adjusting due to movement during sleep. Mask fits well with facial hair.  Gastroesophageal reflux disease (GERD) Significant stomach pain without pantoprazole . Pantoprazole  effective. - Continue pantoprazole  indefinitely.  Vitamin D  deficiency Vitamin D  level at 12. Did not receive supplement due to pharmacy issue. - Send prescription for vitamin D  to [facility] pharmacy.   Employment and Return to Work On  medical leave. Exploring part-time, non-managerial role in [place]. - Draft a letter to the employer to explore options for returning to work in a non-managerial role, part-time, and in [place]. - Send the letter through [employer].       Lucie Buttner, PA-C [facility]

## 2024-10-13 NOTE — Progress Notes (Unsigned)
 [facility] [address] [place]. [place] [address] [phone]       OFFICE FOLLOW UP NOTE  [name] Date of Birth:  [birth year] Medical Record Number:  [id number]    Primary neurologist: Dr. Buck Reason for visit: Initial CPAP follow-up .   SUBJECTIVE:   CHIEF COMPLAINT:  Chief Complaint  Patient presents with   RM 8    Patient is here alone for autopap follow up - no issues with his machine     Follow-up visit:  Prior visit: 05/19/2024 Dr. Buck (initial consult visit)  Brief HPI:   Nicholas Decker is a 38 y.o. male who is being followed for OSA on CPAP.  Initial complaints of snoring, excessive daytime somnolence and witnessed apneas.  ESS 16/24.  Completed sleep study 07/01/2024 which showed moderate sleep apnea with primary obstructive component and a lesser central component with total AHI of 20.6/h and central AHI of 7.25/h, O2 nadir of 80%.  AutoPap therapy set up 07/2024    Interval history:  Patient is being seen for initial CPAP compliance visit.  Reports tolerance gradually improving to CPAP and use of FFM. Initially having difficulty as he is a stomach sleeper but has been able to find a good sleeping position more recently. Does note improvement of sleep quality and daytime energy. ESS 10/24, prior to CPAP 16/24.  Feels still mild daytime fatigue possibly due to to recent finding of vitamin D  deficiency and underlying depression/anxiety. He does not nap as frequently during the day, previously taking naps daily for 3-4 hrs per day, now for max of 30 minutes if he does take a nap.  Overall, he feels he is generally doing well with CPAP and has no specific questions or concerns.          ROS:   14 system review of systems performed and negative with exception of those listed in HPI  PMH:  Past Medical History:  Diagnosis Date   Seasonal allergies     PSH: History reviewed. No pertinent surgical history.  Social  History:  Social History   Socioeconomic History   Marital status: Married    Spouse name: Not on file   Number of children: Not on file   Years of education: Not on file   Highest education level: Not on file  Occupational History   Not on file  Tobacco Use   Smoking status: Never   Smokeless tobacco: Never  Vaping Use   Vaping status: Never Used  Substance and Sexual Activity   Alcohol use: Yes    Comment: Social maybe twice a year   Drug use: No   Sexual activity: Yes    Partners: Female  Other Topics Concern   Not on file  Social History Narrative   Heritage manager   Wife is Marine scientist   No children      No caffeine    Social Drivers of Corporate investment banker Strain: Not on file  Food Insecurity: Not on file  Transportation Needs: Not on file  Physical Activity: Not on file  Stress: Not on file  Social Connections: Unknown (10/07/2023)   Received from [facility]   Social Network    Social Network: Not on file  Intimate Partner Violence: Not At Risk (10/07/2023)   Received from [facility]   HITS    Over the last 12 months how often did your partner physically hurt you?: Never    Over the last  12 months how often did your partner insult you or talk down to you?: Never    Over the last 12 months how often did your partner threaten you with physical harm?: Never    Over the last 12 months how often did your partner scream or curse at you?: Never    Family History:  Family History  Problem Relation Age of Onset   Depression Mother    Thyroid disease Mother    Cancer Father        either throat or lung; mets to brain   Heart disease Maternal Grandmother    Heart disease Maternal Grandfather    Cancer Maternal Grandfather        unknown   Cancer Half-Brother 35       pancreatic   Congenital heart disease Half-Brother        d. 50 from flu   Breast cancer Other 39       maternal great aunt   Heart disease Half-Sibling    Sleep  apnea Neg Hx     Medications:   Current Outpatient Medications on File Prior to Visit  Medication Sig Dispense Refill   albuterol  (VENTOLIN  HFA) 108 (90 Base) MCG/ACT inhaler Inhale 2 puffs into the lungs every 6 (six) hours as needed for wheezing or shortness of breath. 8 g 0   ARIPiprazole  (ABILIFY ) 5 MG tablet Take 1 tablet (5 mg total) by mouth daily. 30 tablet 1   busPIRone  (BUSPAR ) 10 MG tablet Take 1 tablet (10 mg total) by mouth 3 (three) times daily. 90 tablet 1   cetirizine (ZYRTEC) 10 MG tablet Take 10 mg by mouth daily.     DENTA 5000 PLUS 1.1 % CREA dental cream Place 1 Application onto teeth at bedtime.     hydrOXYzine  (ATARAX ) 25 MG tablet Take 1 tablet (25 mg total) by mouth 3 (three) times daily as needed for anxiety. 45 tablet 1   ibuprofen  (ADVIL ) 600 MG tablet Take 1 tablet (600 mg total) by mouth every 8 (eight) hours as needed. 30 tablet 0   LORazepam (ATIVAN) 0.5 MG tablet Take 1 tablet (0.5 mg total) by mouth 2 (two) times daily as needed for anxiety. (Patient taking differently: Take 0.5 mg by mouth 2 (two) times daily as needed for anxiety. Has not started) 30 tablet 1   pantoprazole  (PROTONIX ) 40 MG tablet TAKE 1 TABLET BY MOUTH DAILY 90 tablet 1   sertraline  (ZOLOFT ) 100 MG tablet Take 2 tablets (200 mg total) by mouth daily. 60 tablet 1   traZODone  (DESYREL ) 50 MG tablet Take 1 tablet (50 mg total) by mouth at bedtime as needed for sleep. (Patient taking differently: Take 50 mg by mouth at bedtime as needed for sleep. Has not started) 30 tablet 1   Vitamin D , Ergocalciferol , (DRISDOL ) 1.25 MG (50000 UNIT) CAPS capsule Take 1 capsule (50,000 Units total) by mouth every 7 (seven) days. 12 capsule 0   No current facility-administered medications on file prior to visit.    Allergies:  No Known Allergies    OBJECTIVE:  Physical Exam  Vitals:   10/14/24 1258  BP: 118/85  Pulse: (!) 101  Weight: (!) 326 lb 6.4 oz (148.1 kg)  Height: 5' 10 (1.778 m)   Body  mass index is 46.83 kg/m. No results found.   General: well developed, well nourished, pleasant middle-age Caucasian male, seated, in no evident distress Head: head normocephalic and atraumatic.   Neck: supple with no carotid or supraclavicular  bruits Cardiovascular: regular rate and rhythm, no murmurs  Neurologic Exam Mental Status: Awake and fully alert. Oriented to place and time. Recent and remote memory intact. Attention span, concentration and fund of knowledge appropriate. Mood and affect appropriate.  Cranial Nerves: Pupils equal, briskly reactive to light. Extraocular movements full without nystagmus. Visual fields full to confrontation. Hearing intact. Facial sensation intact. Face, tongue, palate moves normally and symmetrically.  Motor: Normal bulk and tone. Normal strength in all tested extremity muscles Gait and Station: Arises from chair without difficulty. Stance is normal. Gait demonstrates normal stride length and balance without use of AD.         ASSESSMENT/PLAN: Nicholas Decker is a 38 y.o. year old male    OSA on CPAP :  Compliance report shows satisfactory usage with slightly suboptimal residual AHI at 6.3 Continue current pressure settings of 7-13 with EPR 3 Will repeat CPAP download in 6 weeks to assess for improvement of residual AHI which are primarily central. Hopefully with increasing nightly usage and allowing more time on CPAP therapy, central apneas will gradually resolve/improve. If still having elevated residual AHI, will consider making pressure setting adjustments or possibly pursue titration study Discussed continued nightly usage with ensuring greater than 4 hours nightly for optimal benefit and per insurance purposes.   Continue to follow with DME company for any needed supplies or CPAP related concerns CPAP set up 07/2024     Follow up in 1 year via MyChart video visit or call earlier if needed   CC:  PCP: Job Lukes, PA    I  personally spent a total of 25 minutes in the care of the patient today including preparing to see the patient, getting/reviewing separately obtained history, performing a medically appropriate exam/evaluation, counseling and educating, placing orders, and documenting clinical information in the EHR.  Harlene Bogaert, AGNP-BC  [facility] [address] [place], [place] [address]  Phone [phone] Fax [phone] Note: This document was prepared with digital dictation and possible smart phrase technology. Any transcriptional errors that result from this process are unintentional.

## 2024-10-14 ENCOUNTER — Ambulatory Visit: Admitting: Adult Health

## 2024-10-14 ENCOUNTER — Encounter: Payer: Self-pay | Admitting: Adult Health

## 2024-10-14 VITALS — BP 118/85 | HR 101 | Ht 70.0 in | Wt 326.4 lb

## 2024-10-14 DIAGNOSIS — G4733 Obstructive sleep apnea (adult) (pediatric): Secondary | ICD-10-CM

## 2024-10-14 NOTE — Patient Instructions (Signed)
 Your Plan:  Continue nightly use of CPAP - will recheck a download report beginning of December to see if residual apneas have improved - I will contact you via MyChart regarding this  Continue to follow with DME company [employer] for any needed supplies or CPAP related concerns     Follow up in 1 year or call earlier if needed     Thank you for coming to see us  at [facility]. I hope we have been able to provide you high quality care today.  You may receive a patient satisfaction survey over the next few weeks. We would appreciate your feedback and comments so that we may continue to improve ourselves and the health of our patients.

## 2024-10-14 NOTE — Progress Notes (Signed)
 Nicholas Decker

## 2024-10-20 ENCOUNTER — Ambulatory Visit (HOSPITAL_COMMUNITY): Admitting: Licensed Clinical Social Worker

## 2024-10-20 ENCOUNTER — Telehealth (INDEPENDENT_AMBULATORY_CARE_PROVIDER_SITE_OTHER): Admitting: Registered Nurse

## 2024-10-20 ENCOUNTER — Encounter (HOSPITAL_COMMUNITY): Payer: Self-pay | Admitting: Registered Nurse

## 2024-10-20 DIAGNOSIS — F332 Major depressive disorder, recurrent severe without psychotic features: Secondary | ICD-10-CM | POA: Diagnosis not present

## 2024-10-20 DIAGNOSIS — F411 Generalized anxiety disorder: Secondary | ICD-10-CM

## 2024-10-20 DIAGNOSIS — G4700 Insomnia, unspecified: Secondary | ICD-10-CM

## 2024-10-20 MED ORDER — BUSPIRONE HCL 10 MG PO TABS: 10.0000 mg | ORAL_TABLET | Freq: Three times a day (TID) | ORAL | 1 refills | Status: AC

## 2024-10-20 MED ORDER — SERTRALINE HCL 100 MG PO TABS: 200.0000 mg | ORAL_TABLET | Freq: Every day | ORAL | 1 refills | Status: AC

## 2024-10-20 MED ORDER — ARIPIPRAZOLE 5 MG PO TABS: 5.0000 mg | ORAL_TABLET | Freq: Every day | ORAL | 1 refills | Status: AC

## 2024-10-20 MED ORDER — QUETIAPINE FUMARATE 25 MG PO TABS
25.0000 mg | ORAL_TABLET | Freq: Every day | ORAL | 1 refills | Status: AC | PRN
Start: 2024-10-20 — End: ?

## 2024-10-20 MED ORDER — HYDROXYZINE HCL 25 MG PO TABS
25.0000 mg | ORAL_TABLET | Freq: Three times a day (TID) | ORAL | 0 refills | Status: AC | PRN
Start: 2024-10-20 — End: ?

## 2024-10-20 NOTE — Patient Instructions (Addendum)
 If no one has contacted, you by the end of business day today please call the appropriate office listed below to schedule your next visit for medication management with Luisa Ruder, NP:    [facility] [address], [place], [place] [address]  5.4 mi Phone: [phone] (Call to schedule appointment)  [facility] [address], [place], [place] [address]  25 mi Phone: [phone] (Call to schedule appointment)  Call 911, [phone], mobile crisis, or present to the nearest emergency room should you experience any suicidal/homicidal ideation, auditory/visual/hallucinations, or detrimental worsening of your mental health.  Mobile Crisis Response Teams Listed by counties in vicinity of [facility] providers [facility] [phone] [facility] [phone] [facility] [phone] [facility] [phone] [place]                * [facility] [phone]                * [facility] [phone]  [facility] [phone] [facility]  [phone] * [facility] [employer] [phone]      Long-term use of benzodiazepines can lead to several adverse effects, including: Cognitive Impairment: Issues such as memory problems, decreased attention, and impaired cognitive abilities. Physical Health Risks: Increased risk of falls and fractures, especially in older adults.  Mental Health Issues: Mood swings, depression, and increased anxiety.  Dependence and Withdrawal: Physical dependence can develop, leading to withdrawal symptoms like irritability, sleep disturbances, and flu-like symptoms.  Social and Occupational Impact: Problems with employment  and social interactions.  Gradual reduction under medical supervision is recommended to mitigate withdrawal symptoms.  Alternatives There are several alternatives to benzodiazepines for managing anxiety and related conditions. Some of the common options include: Selective Serotonin Reuptake Inhibitors (SSRIs): These medications, such as sertraline  and fluoxetine, are often used as first-line treatments for anxiety disorders.  Serotonin-Norepinephrine Reuptake Inhibitors (SNRIs): Examples include venlafaxine and duloxetine, which are also effective for anxiety.  Buspirone : This is a non-benzodiazepine medication specifically for anxiety, with a lower risk of dependence.  Beta-Blockers: Medications like propranolol can help manage physical symptoms of anxiety, such as rapid heartbeat.  Pregabalin and Gabapentin: These are used for anxiety and have a different mechanism of action compared to benzodiazepines.  Hydroxyzine : An antihistamine that can be used for anxiety.

## 2024-10-20 NOTE — Progress Notes (Signed)
 [facility] MD/PA/NP OP Progress Note  10/20/2024 12:18 PM Nicholas Decker  MRN:  [id number]  Virtual Visit via Video Note  I connected with Nicholas Decker on 10/20/24 at 11:30 AM EDT by a video enabled telemedicine application and verified that I am speaking with the correct person using two identifiers.  Location: Patient: Home Provider: Home office   I discussed the limitations of evaluation and management by telemedicine and the availability of in person appointments. The patient expressed understanding and agreed to proceed.  I discussed the assessment and treatment plan with the patient. The patient was provided an opportunity to ask questions and all were answered. The patient agreed with the plan and demonstrated an understanding of the instructions.   The patient was advised to call back or seek an in-person evaluation if the symptoms worsen or if the condition fails to improve as anticipated.  I provided 30 minutes of non-face-to-face time during this encounter.   Nicholas Ruder, NP   Chief Complaint:  Chief Complaint  Patient presents with   Follow-up    Medication management   HPI: Nicholas Decker 38 y.o. male presents to office today for medication management follow up.  He is seen via virtual video visit by this provider, and chart reviewed on 10/20/24.  His psychiatric history is significant for general anxiety and major depression.  His mental health is currently managed with Vistaril  25 mg Tid prn, Zoloft  200 mg daily, Abilify  5 mg daily, and Buspar  10 mg Tid.  He denies adverse reaction to current medications.  He reports there has been some improvement with medications he reports passive suicidal ideations have decreased.  I'm not having them as often as I was honestly.  They were every day now there may be fleeting thoughts about 2-3 times a week.  Never any intent or plan just fleeting thoughts.  Reports he is very open about his mental health his wife and family are aware  and are very supportive.  He reports he feels slightly that thoughts are only passive there is no intent or plan.  Reports he has started his counseling services with Nicholas Sink, LCSW and also feels that it is helpful reports eating and sleeping without any difficulty.  He stated that he has spoke to his primary care provider about his increased anxiety when riding in the car with his wife..  Reports he was prescribed 10 tablets of Ativan She was supposed to contact you because she did not want to slip on your toes or anything.  Informed that I did not prescribe long-term use of benzodiazepines because there were other medications after they were just as well.  Informed they do work great but they are only meant for short-term use discussed side effects of long-term use of benzodiazepines.  Reports the Vistaril  does not help in situations propranolol did not help.  Discussed gabapentin, Seroquel.  He agrees to a trial of Seroquel. He denies active/passive suicidal ideation at this time.  He also denies self-harm/homicidal ideation, psychosis, paranoia, and abnormal movement.  Screenings completed during today's visit Nutrition, and Pain, see scores below.    Recommended the following: Continue Zoloft  200 mg daily and Vistaril  25 mg 3 times daily as needed, Abilify  5 mg daily, and BuSpar  10 mg 3 times daily.  Start Seroquel 25 mg daily as needed for anxiety.  Discussed efficacy/side effects of Seroquel and educational material also added to AVS.  He voices understanding/agreement with information/recommendations being given to him today.  Visit Diagnosis:    ICD-10-CM   1. Severe episode of recurrent major depressive disorder, without psychotic features (HCC)  F33.2 busPIRone  (BUSPAR ) 10 MG tablet    ARIPiprazole  (ABILIFY ) 5 MG tablet    sertraline  (ZOLOFT ) 100 MG tablet    2. GAD (generalized anxiety disorder)  F41.1 hydrOXYzine  (ATARAX ) 25 MG tablet    busPIRone  (BUSPAR ) 10 MG tablet    sertraline   (ZOLOFT ) 100 MG tablet    QUEtiapine (SEROQUEL) 25 MG tablet    3. Insomnia, unspecified type  G47.00 hydrOXYzine  (ATARAX ) 25 MG tablet       Past Psychiatric History:  Diagnosis: Major depression, general anxiety Suicide attempt: He reports several suicide attempts that occurred several months ago during a period at which he would get in his car and drive with his eyes closed. Non-suicidal self-injurious behavior: Denies Psychiatric hospitalization: Denies Substance abuse: Reports occasional use of CBD edibles Past psychotropic medication trials: Remeron, Wellbutrin   Past Medical History:  Past Medical History:  Diagnosis Date   Seasonal allergies    History reviewed. No pertinent surgical history.  Family Psychiatric History: See below and family history  Family History:  Family History  Problem Relation Age of Onset   Depression Mother    Thyroid disease Mother    Cancer Father        either throat or lung; mets to brain   Heart disease Maternal Grandmother    Heart disease Maternal Grandfather    Cancer Maternal Grandfather        unknown   Cancer Half-Brother 4       pancreatic   Congenital heart disease Half-Brother        d. 55 from flu   Breast cancer Other 4       maternal great aunt   Heart disease Half-Sibling    Sleep apnea Neg Hx     Social History:  Social History   Socioeconomic History   Marital status: Married    Spouse name: Not on file   Number of children: Not on file   Years of education: Not on file   Highest education level: Not on file  Occupational History   Not on file  Tobacco Use   Smoking status: Never   Smokeless tobacco: Never  Vaping Use   Vaping status: Never Used  Substance and Sexual Activity   Alcohol use: Yes    Comment: Social maybe twice a year   Drug use: No   Sexual activity: Yes    Partners: Female  Other Topics Concern   Not on file  Social History Narrative   Heritage manager   Wife is  Marine scientist   No children      No caffeine    Social Drivers of Corporate investment banker Strain: Not on file  Food Insecurity: Not on file  Transportation Needs: Not on file  Physical Activity: Not on file  Stress: Not on file  Social Connections: Unknown (10/07/2023)   Received from [facility]   Social Network    Social Network: Not on file    Allergies: No Known Allergies  Metabolic Disorder Labs: Lab Results  Component Value Date   HGBA1C 5.4 04/09/2024   No results found for: PROLACTIN Lab Results  Component Value Date   CHOL 200 04/09/2024   TRIG 127.0 04/09/2024   HDL 38.30 (L) 04/09/2024   CHOLHDL 5 04/09/2024   VLDL 25.4 04/09/2024   LDLCALC 136 (H) 04/09/2024   LDLCALC  116 (H) 03/06/2023   Lab Results  Component Value Date   TSH 2.35 04/09/2024   TSH 1.75 06/17/2017    Current Medications: Current Outpatient Medications  Medication Sig Dispense Refill   QUEtiapine (SEROQUEL) 25 MG tablet Take 1 tablet (25 mg total) by mouth daily as needed (increased anxiety). 30 tablet 1   albuterol  (VENTOLIN  HFA) 108 (90 Base) MCG/ACT inhaler Inhale 2 puffs into the lungs every 6 (six) hours as needed for wheezing or shortness of breath. 8 g 0   ARIPiprazole  (ABILIFY ) 5 MG tablet Take 1 tablet (5 mg total) by mouth daily. 90 tablet 1   busPIRone  (BUSPAR ) 10 MG tablet Take 1 tablet (10 mg total) by mouth 3 (three) times daily. 270 tablet 1   cetirizine (ZYRTEC) 10 MG tablet Take 10 mg by mouth daily.     DENTA 5000 PLUS 1.1 % CREA dental cream Place 1 Application onto teeth at bedtime.     hydrOXYzine  (ATARAX ) 25 MG tablet Take 1 tablet (25 mg total) by mouth 3 (three) times daily as needed for anxiety. 130 tablet 0   ibuprofen  (ADVIL ) 600 MG tablet Take 1 tablet (600 mg total) by mouth every 8 (eight) hours as needed. 30 tablet 0   LORazepam (ATIVAN) 0.5 MG tablet Take 1 tablet (0.5 mg total) by mouth 2 (two) times daily as needed for anxiety. (Patient  taking differently: Take 0.5 mg by mouth 2 (two) times daily as needed for anxiety. Has not started) 30 tablet 1   pantoprazole  (PROTONIX ) 40 MG tablet TAKE 1 TABLET BY MOUTH DAILY 90 tablet 1   sertraline  (ZOLOFT ) 100 MG tablet Take 2 tablets (200 mg total) by mouth daily. 180 tablet 1   Vitamin D , Ergocalciferol , (DRISDOL ) 1.25 MG (50000 UNIT) CAPS capsule Take 1 capsule (50,000 Units total) by mouth every 7 (seven) days. 12 capsule 0   No current facility-administered medications for this visit.     Musculoskeletal: Strength & Muscle Tone: Unable to assess via virtual visit Gait & Station: Unable to assess via virtual visit Patient leans: N/A  Psychiatric Specialty Exam: Review of Systems  Constitutional:        No other complaints voiced at this time  Psychiatric/Behavioral:  Positive for dysphoric mood and sleep disturbance (Improved). Negative for hallucinations and self-injury. Agitation: Improved mood. Suicidal ideas: Denies active/passive suicidal thoughts at this time.  Also reports a decreased in suicidal thoughts that are more fleeting.The patient is nervous/anxious (Improving.  Reports most anxious or increased anxiety when he is riding in the car with his wife.  Not just around town but further distances like when driving to [place] ).   All other systems reviewed and are negative.   There were no vitals taken for this visit.There is no height or weight on file to calculate BMI.  General Appearance: Casual  Eye Contact:  Good  Speech:  Clear and Coherent and Normal Rate  Volume:  Normal  Mood:  Depressed and Euthymic  Affect:  Appropriate and Congruent  Thought Process:  Coherent, Goal Directed, and Descriptions of Associations: Intact  Orientation:  Full (Time, Place, and Person)  Thought Content: Logical   Suicidal Thoughts:  No  Homicidal Thoughts:  No  Memory:  Immediate;   Good Recent;   Good Remote;   Good  Judgement:  Intact  Insight:  Present   Psychomotor Activity:  Normal  Concentration:  Concentration: Good and Attention Span: Good  Recall:  Good  Fund of Knowledge: Good  Language: Good  Akathisia:  No  Handed:  Right  AIMS (if indicated): not done  Assets:  Communication Skills Desire for Improvement Housing Leisure Time Resilience Social Support Transportation  ADL's:  Intact  Cognition: WNL  Sleep:  Good   Screenings: AIMS    Flowsheet Row Video Visit from 10/20/2024 in [facility] at [facility] Video Visit from 09/08/2024 in [facility] at [facility]  AIMS Total Score 0 0   GAD-7    Flowsheet Row Office Visit from 10/13/2024 in [facility] [place] Video Visit from 09/08/2024 in [facility] at [facility] Office Visit from 07/13/2024 in [facility] at [facility] Office Visit from 04/09/2024 in [facility] [place] [employer] Visit from 10/16/2023 in [facility] [place]  Total GAD-7 Score 14 10 20 18  0   PHQ2-9    Flowsheet Row Office Visit from 10/13/2024 in [facility] [place] [employer] from 09/22/2024 in [facility] at [facility] Video Visit from 09/08/2024 in [facility] at [facility] Office Visit from 07/13/2024 in [facility] at [facility] Office Visit from 04/09/2024 in [facility] at [place]  PHQ-2 Total Score 5 5 5 6 5   PHQ-9 Total Score 22 21 19 24 24    Flowsheet Row Counselor from 09/22/2024 in [facility] at [facility] Video Visit from 09/08/2024 in [facility] at [facility] Office Visit from 07/13/2024 in [facility] at [facility]  C-SSRS RISK CATEGORY Moderate Risk Low Risk No Risk   Assessment and Plan:  Assessment: Patient seen and examined as noted above. Summary: Today Nicholas Decker appears to be doing fairly well.  He reports that there has been some improvement in mental health with latest adjustment of medications.  Reports he is feeling better but continues to have increased anxiety whenever he is riding in the car with his wife.  Reports his PCP prescribed him 10 tablets of Ativan but wanted him to consult with provider for any other suggestions.  Informed and discussed long-term benzodiazepine use, other medications that are more appropriate, and that this provider did not prescribe long-term use of Benzodiazepines.  Reports he is eating and sleeping without any difficulty.  He denies active/passive suicidal ideation at this time.  He also denies self-harm/homicidal ideation, psychosis, paranoia, and abnormal movement. During visit he is dressed appropriate for age and weather.  He is seated comfortably in view of camera with no noted distress.  He is alert/oriented x 4, calm/cooperative and mood is congruent with affect.  He spoke in a clear tone at moderate volume, and normal pace, with good eye contact.  His thought process is coherent, relevant, and there is no indication that he is currently responding to internal/external stimuli or experiencing delusional thought content.    1. GAD (generalized anxiety disorder) - hydrOXYzine  (ATARAX ) 25 MG tablet; Take 1 tablet (25 mg total) by mouth 3 (three) times daily as needed for anxiety.  Dispense: 130 tablet; Refill: 0 - busPIRone  (BUSPAR ) 10 MG tablet; Take 1 tablet (10 mg total) by mouth 3 (three) times daily.  Dispense: 270 tablet; Refill: 1 - sertraline  (ZOLOFT ) 100 MG tablet; Take 2 tablets (200 mg total) by mouth daily.  Dispense: 180 tablet; Refill: 1 - QUEtiapine (SEROQUEL) 25 MG tablet; Take 1  tablet (25 mg total) by mouth daily as needed (increased anxiety).  Dispense: 30 tablet; Refill: 1  2. Insomnia, unspecified type - hydrOXYzine  (ATARAX ) 25 MG tablet; Take 1 tablet (25 mg total) by mouth 3 (three) times daily as needed for anxiety.  Dispense: 130 tablet; Refill: 0  3. Severe episode of recurrent major depressive disorder, without psychotic features (HCC) (Primary) - busPIRone  (BUSPAR ) 10 MG tablet; Take 1 tablet (10 mg total) by mouth 3 (three) times daily.  Dispense: 270 tablet; Refill: 1 - ARIPiprazole  (ABILIFY ) 5 MG tablet; Take 1 tablet (5 mg total) by mouth daily.  Dispense: 90 tablet; Refill: 1 - sertraline  (ZOLOFT ) 100 MG tablet; Take 2 tablets (200 mg total) by mouth daily.  Dispense: 180 tablet; Refill: 1    Plan: Medications: Meds ordered this encounter  Medications   hydrOXYzine  (ATARAX ) 25 MG tablet    Sig: Take 1 tablet (25 mg total) by mouth 3 (three) times daily as needed for anxiety.    Dispense:  130 tablet    Refill:  0    Supervising Provider:   ARFEEN, SYED T [[id number]]   busPIRone  (BUSPAR ) 10 MG tablet    Sig: Take 1 tablet (10 mg total) by mouth 3 (three) times daily.    Dispense:  270 tablet    Refill:  1    Supervising Provider:   CURRY, SYED T [[id number]]   ARIPiprazole  (ABILIFY ) 5 MG tablet    Sig: Take 1 tablet (5 mg total) by mouth daily.    Dispense:  90 tablet    Refill:  1    Supervising Provider:   ARFEEN, SYED T [[id number]]   sertraline  (ZOLOFT ) 100 MG tablet    Sig: Take 2 tablets (200 mg total) by mouth daily.    Dispense:  180 tablet    Refill:  1    Supervising Provider:   ARFEEN, SYED T [[id number]]   QUEtiapine (SEROQUEL) 25 MG tablet    Sig: Take 1 tablet (25 mg total) by mouth daily as needed (increased anxiety).    Dispense:  30 tablet    Refill:  1    Supervising Provider:   CURRY LENI DASEN [[id number]]    Labs:  Not indicated at this time  Other:  Counseling/therapy: Continue services with Nicholas Sink, LCSW Nicholas Decker is  instructed to call [phone], [phone], mobile crisis, or present to the nearest emergency room should he experience any suicidal/homicidal ideation, auditory/visual/hallucinations, or detrimental worsening of his mental health condition.   Nicholas Decker participated in the development of this treatment plan and verbalized his understanding/agreement with plan as listed.  Follow Up: Return in 3 month for medication management Call in the interim for any side-effects, decompensation, questions, or problems  Collaboration of Care: Collaboration of Care: Medication Management AEB medication assessment, adjustment, refills, and started Seroquel  Patient/Guardian was advised Release of Information must be obtained prior to any record release in order to collaborate their care with an outside provider. Patient/Guardian was advised if they have not already done so to contact the registration department to sign all necessary forms in order for us  to release information regarding their care.   Consent: Patient/Guardian gives verbal consent for treatment and assignment of benefits for services provided during this visit. Patient/Guardian expressed understanding and agreed to proceed.    Shelsie Tijerino, NP 10/20/2024, 12:18 PM

## 2024-10-25 ENCOUNTER — Ambulatory Visit (INDEPENDENT_AMBULATORY_CARE_PROVIDER_SITE_OTHER): Admitting: Licensed Clinical Social Worker

## 2024-10-25 ENCOUNTER — Telehealth (HOSPITAL_COMMUNITY): Payer: Self-pay | Admitting: Licensed Clinical Social Worker

## 2024-10-25 DIAGNOSIS — F411 Generalized anxiety disorder: Secondary | ICD-10-CM | POA: Diagnosis not present

## 2024-10-25 DIAGNOSIS — F332 Major depressive disorder, recurrent severe without psychotic features: Secondary | ICD-10-CM | POA: Diagnosis not present

## 2024-10-25 DIAGNOSIS — G4700 Insomnia, unspecified: Secondary | ICD-10-CM | POA: Diagnosis not present

## 2024-10-25 NOTE — Progress Notes (Addendum)
 Virtual Visit via Video Note  I connected with Nicholas Decker on 10/25/24 at 11:00 AM EDT by a video enabled telemedicine application and verified that I am speaking with the correct person using two identifiers.  Location: Patient: home Provider: office   I discussed the limitations of evaluation and management by telemedicine and the availability of in person appointments. The patient expressed understanding and agreed to proceed.   I discussed the assessment and treatment plan with the patient. The patient was provided an opportunity to ask questions and all were answered. The patient agreed with the plan and demonstrated an understanding of the instructions.   The patient was advised to call back or seek an in-person evaluation if the symptoms worsen or if the condition fails to improve as anticipated.  I provided 40 minutes of non-face-to-face time during this encounter.  THERAPIST PROGRESS NOTE  Session Time: 11:00 AM to 11:40 AM  Participation Level: Active  Behavioral Response: CasualAlertAnxious and mood improved from seeing friends over the weekend  Type of Therapy: Individual Therapy  Treatment Goals addressed: Depression, patient specifically wants thoughts to be less hurtful in his brain meaning negative thoughts wants to work on self-esteem and anxiety noted explored borderline symptoms to work on those to help with symptoms, coping-completed treatment plan virtually patient gave consent to complete virtually  ProgressTowards Goals: Progressing-encouraged patient's coping strategies for depression worked on anxiety he gets when wife driving lifted strategies for panic  Interventions: CBT, Solution Focused, Strength-based, Supportive, and Other: Coping  Summary: Nicholas Decker is a 38 y.o. male who presents with sharing that he does [employer], has done that since 8th grade and money goes to [name]. Haven't seen friends since COVID wife a product manager and she deals with a lot of death during COVID didn't want to be around it. Political views were different didn't want to be around that at the time. Nothing bad about it.  Patient shares this experience really happy had a blast being around people that love and haven't seen for 5 years. Maybe talk to wife incorporate in life only it is a 35-40 minutes drive. Once a month weekend and go visit. He notes the connection going to help him have to make it work need it in life. Suicidal thoughts didn't think about it talked to friends open up about situation didn't have the thoughts of wanting to do it just where he was in life let people know should talk about it not ashamed. Friend committed suicide a month ago. Family member going through what he is going through not talking about it helpful patient opened up to help know what he was going through.  Therapist noted strategies avoidance not helpful makes symptoms worse and patient relates in the past thought not going to deal with it.  Therapist noted in fact a treatment strategy is to externalize a talk about it helps to work through it.  So actually is a coping strategy.  Noted his primary care reaches out and concerned about him to.  Therapist explained depression interventions include something fun every day something that gives you purpose and connection with others so it is actually medicine.  Therapist asked patient about anxiety in car. When ride with wife she doesn't like he drives slow. When drive with her probably like anyone else can give him anxiety and panic full blown panic when driving has to lay down and not look at the traffic. Asked Nicholas Decker is there a  medication to help him relax.  Therapist look for underlying sources he did have a rack last year was bothering him when driving to [place] very cautious 20 car lengths, 65 limit. Year ago early October 4 or 8th exactly a year ago. Therapist noted for anxiety in general have to work on  less avoidance exposure therapy whether it be an person or whether talking about it helps us  to reprocess memories.  Reviewed chapter on panic to help with some coping strategies.  Therapist explained in a nutshell catastrophic thinking and paying too much attention to her symptoms escalates it.  Talked about fight flight part of anxiety but it is a false alarm.  Helps also to deep breathe to help calm the amygdala down as that is what is in charge and the fighter flight.  We also work with the hippocampus but that is to change the memory so tells the amygdala that it is not dangerous. Putting it altogether accept your symptoms do not fight or resist them resisting or fleeting symptoms of anxiety tends to make them worse.  More you can adopt an attitude of acceptance no matter how unpleasant the symptoms may be the better your ability to cope with it.  Practiced abdominal breathing and therapist went through technique patient will work on it therapist encouraged every day he will do at least 5 minutes then that she is a coping strategy when you are more centered and abdominal breathing or disruption technique for example talking to another person repeating coping statements to continue to manage her feelings.  Any coping strategy that reinforces the basic stance of not giving attention to energy to negative thoughts or uncomfortable body sensations by regularly practicing coping strategies to reinforce. Abdominal breathing alone self as a coping strategy and sometimes it alone will be enough.  Therapist noted you do not want to fight panic more acceptance but you can intervene early with distraction and that can help cut it off from escalating Patient says makes sense gets angry with wife she is the one driving has to trust her that she going to be responsible. Trust her and have to work on that.  Therapist reviewed main points for him to remember incorporate into coping skills deep breathing, remember panic as a  false alarm use distraction distraction.  Patient shares willing to try start off slow will deep breathe for 5 minutes.       Suicidal/Homicidal: No  Plan: Return again in 1 week.2.  Review symptoms look at depression CBT can look at DBT worksheets thoughts and feelings book  Diagnosis: Major depressive disorder recurrent severe, generalized anxiety disorder insomnia unspecified   Collaboration of Care: Medication Management AEB [facility] note  Patient/Guardian was advised Release of Information must be obtained prior to any record release in order to collaborate their care with an outside provider. Patient/Guardian was advised if they have not already done so to contact the registration department to sign all necessary forms in order for us  to release information regarding their care.   Consent: Patient/Guardian gives verbal consent for treatment and assignment of benefits for services provided during this visit. Patient/Guardian expressed understanding and agreed to proceed.   Ronal Sink, LCSW 10/25/2024

## 2024-10-25 NOTE — Telephone Encounter (Signed)
 N/a

## 2024-10-26 ENCOUNTER — Encounter: Payer: Self-pay | Admitting: Neurology

## 2024-11-04 ENCOUNTER — Ambulatory Visit (HOSPITAL_COMMUNITY): Admitting: Licensed Clinical Social Worker

## 2024-11-04 DIAGNOSIS — F332 Major depressive disorder, recurrent severe without psychotic features: Secondary | ICD-10-CM

## 2024-11-04 DIAGNOSIS — F411 Generalized anxiety disorder: Secondary | ICD-10-CM | POA: Diagnosis not present

## 2024-11-04 DIAGNOSIS — G4700 Insomnia, unspecified: Secondary | ICD-10-CM | POA: Diagnosis not present

## 2024-11-04 NOTE — Progress Notes (Signed)
 Virtual Visit via Video Note  I connected with Nicholas Decker on 11/04/24 at  8:00 AM EST by a video enabled telemedicine application and verified that I am speaking with the correct person using two identifiers.  Location: Patient: home Provider: home office   I discussed the limitations of evaluation and management by telemedicine and the availability of in person appointments. The patient expressed understanding and agreed to proceed.   I discussed the assessment and treatment plan with the patient. The patient was provided an opportunity to ask questions and all were answered. The patient agreed with the plan and demonstrated an understanding of the instructions.   The patient was advised to call back or seek an in-person evaluation if the symptoms worsen or if the condition fails to improve as anticipated.  I provided 45 minutes of non-face-to-face time during this encounter.  THERAPIST PROGRESS NOTE  Session Time: 8:00 AM to 8:45 AM  Participation Level: Active  Behavioral Response: CasualAlertDepressed  Type of Therapy: Individual Therapy  Treatment Goals addressed: Depression, patient specifically wants thoughts to be less hurtful in his brain meaning negative thoughts wants to work on self-esteem and anxiety noted explored borderline symptoms to work on those to help with symptoms, coping-  ProgressTowards Goals: Progressing-encouraged patient with positive coping he started such as seeing friends encouraged more regular deep breathing looked at CBT for depression for patient to begin to learn tools to help him manage depression  Interventions: CBT, Solution Focused, Strength-based, and Other: Coping  Summary: Nicholas Decker is a 38 y.o. male who presents with did try the deep breathing sitting in car and helped a little bit. Stop driving parked breathing help a little.  Therapist encouraged patient to do this regularly every day will be more effective.  Patient checked in  doing pretty good just got back yesterday second weekend with friends spent more time Thursday to Monday went really well. Plans to spend a weekend a month go to Christmas parades with them. Halloween thing a lot of people when just them 4-6 people.  Therapist reviewed with patient a CBT worksheet highlighting patient is on track the first place that she start with depression as doing more positive things usually the first place that you start. Therapist explained just increasing her activity and exercise levels can make an enormous impact on her mood as it stimulates the body to produce natural antidepressants just increasing her activity and exercise levels can make it in [place]'s impact on her mood by making us  feel better about ourselves making us  feel less tired motivating us  to do more improving her ability to think more clearly helping us  think about something other than focusing on a helpful thoughts using up the adrenaline resources created by anxiety and anger increasing motivation giving us  a sense of achievement enjoyment being with other people stimulating the body to produce natural antidepressants making is generally more healthy stimulating her appetite noted be wanted do a's activities and therapist explained we want to do them in daily something that gives us  a sense of achievement connection to others something enjoys noted here again patient is on track by connecting more with friends noted one of the treatment strategies for depression is just to start to put more positive activities into your schedule that make you feel good and figuring out what those are therapist noted the vicious cycle of depression were triggered and it really is our thoughts the cause are feelings so noticing more our thoughts that are distorted  and negative challenging them reframing to more positive and something more accurate notice the cycle includes being triggered having thoughts and then feelings and behaviors  start to be not motivated slow down isolate cut off from other people so we intervene by engaging more positive activities and challenging her negative thoughts.  Therapist noted as well STOPP which is a basic emotional regulation strategy which means to tell yourself.  Take of breath observe what is going on pullback get perspective again that will help with thought challenges and then practice what works.  Therapist noted the key and air slowing down so you have a better response to things rather than reacting impulsively taking the breath also helping to calm down and to help with perspective.  Therapist reviewed with patient nature of thoughts why can be hard to catch their automatic appear in short hand or personal to ourselves to catch the thought.  Not all thoughts or problem just the ones that lead to a negative emotion the ones that you want to catch.  Reviewed with patient sessions a lot of things he needs to practice that was helpful we talked as well that therapist is going part-time patient willing to transfer we will see how it goes and if he wants to come back to therapist is more comfortable that is a possibility and we can then decide how to move forward.  Suicidal/Homicidal: No  Plan: 1.patient willing to transfer as therapist going part-time prefers male therapist but willing to try male therapist 2.  Patient to begin to work on skills he is learning for depression report progress with use of skills  Diagnosis: Major depressive disorder recurrent severe, generalized anxiety disorder insomnia unspecified  Collaboration of Care: Other none needed  Patient/Guardian was advised Release of Information must be obtained prior to any record release in order to collaborate their care with an outside provider. Patient/Guardian was advised if they have not already done so to contact the registration department to sign all necessary forms in order for us  to release information regarding their  care.   Consent: Patient/Guardian gives verbal consent for treatment and assignment of benefits for services provided during this visit. Patient/Guardian expressed understanding and agreed to proceed.   Ronal Sink, LCSW 11/04/2024

## 2024-12-02 ENCOUNTER — Ambulatory Visit (INDEPENDENT_AMBULATORY_CARE_PROVIDER_SITE_OTHER): Admitting: Licensed Clinical Social Worker

## 2024-12-02 ENCOUNTER — Encounter: Payer: Self-pay | Admitting: Adult Health

## 2024-12-02 DIAGNOSIS — F332 Major depressive disorder, recurrent severe without psychotic features: Secondary | ICD-10-CM

## 2024-12-02 DIAGNOSIS — G4700 Insomnia, unspecified: Secondary | ICD-10-CM

## 2024-12-02 DIAGNOSIS — F411 Generalized anxiety disorder: Secondary | ICD-10-CM

## 2024-12-02 NOTE — Progress Notes (Signed)
 This patient already agreed to transfer therapist as therapist going part-time he asked why have session today when we will have a new therapist therapist agreed with plan.  This will be a left appointment and plan is for office manager to call to get him set up with male therapist.

## 2024-12-16 ENCOUNTER — Ambulatory Visit (HOSPITAL_COMMUNITY): Admitting: Licensed Clinical Social Worker

## 2024-12-28 ENCOUNTER — Telehealth: Payer: Self-pay | Admitting: *Deleted

## 2024-12-28 NOTE — Telephone Encounter (Signed)
 Spoke to pt told him can fill out form fill just send to us . Pt said he will have to bring to office. Told him that is fine Lucie will not be back in the office till Monday. Pt verbalized understanding.

## 2024-12-28 NOTE — Telephone Encounter (Signed)
 Copied from CRM #[id number]. Topic: Clinical - Medical Advice >> Dec 27, 2024  1:17 PM Zy'onna H wrote: Reason for CRM: Patient called in stating he recently was employed by a new company, and they are requiring a Physical Form be completed by his PCP.   He already had his physical in October and was inquiring if the [employer] could complete the from instead of needing a 2nd physical.   Please Advise.

## 2025-01-03 ENCOUNTER — Telehealth: Payer: Self-pay | Admitting: Physician Assistant

## 2025-01-03 NOTE — Telephone Encounter (Signed)
 Type of form received: Physical Forms  Additional comments: Patient requesting call for pick up  Received by: Nicholas Negron  Form should be Faxed to:  Form should be mailed to:    Is patient requesting call for pickup: Yes   Form placed:  Placed in Provider box  Attach charge sheet. Yes  Individual made aware of 3-5 business day turn around (Y/N)? Yes

## 2025-01-04 NOTE — Telephone Encounter (Signed)
 Form put in Samantha's folder to sign.

## 2025-01-05 NOTE — Telephone Encounter (Signed)
 See other message

## 2025-01-05 NOTE — Telephone Encounter (Signed)
 Left detailed message on personal voicemail form is completed and ready to pick up. I will put it at the front desk for you. Any questions call office.

## 2025-01-05 NOTE — Telephone Encounter (Signed)
 Lucie returned form completed and signed.

## 2025-01-19 ENCOUNTER — Telehealth (HOSPITAL_COMMUNITY): Admitting: Registered Nurse

## 2025-10-17 ENCOUNTER — Encounter: Admitting: Physician Assistant

## 2025-10-20 ENCOUNTER — Telehealth: Admitting: Adult Health
